# Patient Record
Sex: Female | Born: 1950 | Race: White | Hispanic: No | Smoking: Never smoker
Health system: Southern US, Community
[De-identification: ages and names within clinical notes are randomized; demographics above are authoritative.]

## PROBLEM LIST (undated history)

## (undated) DIAGNOSIS — I2699 Other pulmonary embolism without acute cor pulmonale: Secondary | ICD-10-CM

## (undated) DIAGNOSIS — M1990 Unspecified osteoarthritis, unspecified site: Secondary | ICD-10-CM

## (undated) DIAGNOSIS — I10 Essential (primary) hypertension: Secondary | ICD-10-CM

## (undated) DIAGNOSIS — E119 Type 2 diabetes mellitus without complications: Secondary | ICD-10-CM

## (undated) HISTORY — DX: Unspecified osteoarthritis, unspecified site: M19.90

## (undated) HISTORY — DX: Type 2 diabetes mellitus without complications: E11.9

## (undated) HISTORY — PX: PARTIAL HYSTERECTOMY: SHX80

## (undated) HISTORY — DX: Essential (primary) hypertension: I10

## (undated) HISTORY — PX: TOTAL HIP ARTHROPLASTY: SHX124

---

## 2005-09-04 DIAGNOSIS — I2699 Other pulmonary embolism without acute cor pulmonale: Secondary | ICD-10-CM

## 2005-09-04 HISTORY — DX: Other pulmonary embolism without acute cor pulmonale: I26.99

## 2006-09-04 HISTORY — PX: OTHER SURGICAL HISTORY: SHX169

## 2018-05-20 DIAGNOSIS — Z1211 Encounter for screening for malignant neoplasm of colon: Secondary | ICD-10-CM | POA: Diagnosis not present

## 2018-05-20 DIAGNOSIS — L600 Ingrowing nail: Secondary | ICD-10-CM | POA: Diagnosis not present

## 2018-05-20 DIAGNOSIS — E1165 Type 2 diabetes mellitus with hyperglycemia: Secondary | ICD-10-CM | POA: Diagnosis not present

## 2018-05-20 DIAGNOSIS — Z1239 Encounter for other screening for malignant neoplasm of breast: Secondary | ICD-10-CM | POA: Diagnosis not present

## 2018-05-20 DIAGNOSIS — M87052 Idiopathic aseptic necrosis of left femur: Secondary | ICD-10-CM | POA: Diagnosis not present

## 2018-05-20 DIAGNOSIS — I872 Venous insufficiency (chronic) (peripheral): Secondary | ICD-10-CM | POA: Diagnosis not present

## 2018-05-21 ENCOUNTER — Other Ambulatory Visit: Payer: Self-pay | Admitting: Internal Medicine

## 2018-05-21 DIAGNOSIS — Z1231 Encounter for screening mammogram for malignant neoplasm of breast: Secondary | ICD-10-CM

## 2018-05-28 DIAGNOSIS — E119 Type 2 diabetes mellitus without complications: Secondary | ICD-10-CM | POA: Diagnosis not present

## 2018-05-28 DIAGNOSIS — N390 Urinary tract infection, site not specified: Secondary | ICD-10-CM | POA: Diagnosis not present

## 2018-05-31 ENCOUNTER — Emergency Department: Payer: Medicare HMO

## 2018-05-31 ENCOUNTER — Emergency Department
Admission: EM | Admit: 2018-05-31 | Discharge: 2018-05-31 | Disposition: A | Discharge status: Home / Self Care | Payer: Medicare HMO | Attending: Emergency Medicine | Admitting: Emergency Medicine

## 2018-05-31 ENCOUNTER — Other Ambulatory Visit: Payer: Self-pay

## 2018-05-31 DIAGNOSIS — N201 Calculus of ureter: Secondary | ICD-10-CM | POA: Diagnosis not present

## 2018-05-31 DIAGNOSIS — Z79899 Other long term (current) drug therapy: Secondary | ICD-10-CM | POA: Insufficient documentation

## 2018-05-31 DIAGNOSIS — Z86711 Personal history of pulmonary embolism: Secondary | ICD-10-CM | POA: Insufficient documentation

## 2018-05-31 DIAGNOSIS — N132 Hydronephrosis with renal and ureteral calculous obstruction: Secondary | ICD-10-CM | POA: Diagnosis not present

## 2018-05-31 DIAGNOSIS — R109 Unspecified abdominal pain: Secondary | ICD-10-CM | POA: Diagnosis present

## 2018-05-31 HISTORY — DX: Other pulmonary embolism without acute cor pulmonale: I26.99

## 2018-05-31 LAB — CBC
HCT: 31.6 % — ABNORMAL LOW (ref 35.0–47.0)
Hemoglobin: 10.9 g/dL — ABNORMAL LOW (ref 12.0–16.0)
MCH: 28.2 pg (ref 26.0–34.0)
MCHC: 34.6 g/dL (ref 32.0–36.0)
MCV: 81.6 fL (ref 80.0–100.0)
PLATELETS: 431 10*3/uL (ref 150–440)
RBC: 3.87 MIL/uL (ref 3.80–5.20)
RDW: 13.1 % (ref 11.5–14.5)
WBC: 12.8 10*3/uL — AB (ref 3.6–11.0)

## 2018-05-31 LAB — BASIC METABOLIC PANEL
Anion gap: 10 (ref 5–15)
BUN: 10 mg/dL (ref 8–23)
CALCIUM: 8.8 mg/dL — AB (ref 8.9–10.3)
CO2: 26 mmol/L (ref 22–32)
CREATININE: 0.68 mg/dL (ref 0.44–1.00)
Chloride: 102 mmol/L (ref 98–111)
GFR calc non Af Amer: >60 mL/min (ref >60)
Glucose, Bld: 128 mg/dL — ABNORMAL HIGH (ref 70–99)
Potassium: 3.2 mmol/L — ABNORMAL LOW (ref 3.5–5.1)
SODIUM: 138 mmol/L (ref 135–145)

## 2018-05-31 LAB — URINALYSIS, COMPLETE (UACMP) WITH MICROSCOPIC
BILIRUBIN URINE: NEGATIVE
Glucose, UA: NEGATIVE mg/dL
HGB URINE DIPSTICK: NEGATIVE
KETONES UR: NEGATIVE mg/dL
Nitrite: NEGATIVE
Protein, ur: 100 mg/dL — AB
Specific Gravity, Urine: 1.019 (ref 1.005–1.030)
pH: 6 (ref 5.0–8.0)

## 2018-05-31 MED ORDER — SODIUM CHLORIDE 0.9 % IV BOLUS: 250.0000 mL | Freq: Once | INTRAVENOUS | Status: DC

## 2018-05-31 MED ORDER — ONDANSETRON 4 MG PO TBDP: 4.0000 mg | ORAL_TABLET | Freq: Three times a day (TID) | ORAL | 0 refills | Status: DC | PRN

## 2018-05-31 MED ORDER — KETOROLAC TROMETHAMINE 30 MG/ML IJ SOLN
30.0000 mg | Freq: Once | INTRAMUSCULAR | Status: AC
  Administered 2018-05-31: 30 mg via INTRAVENOUS
  Filled 2018-05-31: qty 1

## 2018-05-31 NOTE — ED Notes (Signed)
Bladder scan shows currently in bladder

## 2018-05-31 NOTE — ED Triage Notes (Signed)
Pt arrives to ed via POV with complaints of left flank and hip pain. Pt states " I feel like I have a kidney stone", hx of kidney stones, this pain feels similar. Pt also states she has left hip pain, surgery in 2008 on left hip. Pt states 3 days ago pt had to wear depends bc she was having a constatnt trickle, not she is only able to pass a couple tablespoons of urine at a time starting last night. NAD noted at this time.

## 2018-05-31 NOTE — ED Notes (Signed)
Patient to Room 2 via [facility], Morrie Sheldon RN aware of placement.

## 2018-05-31 NOTE — ED Provider Notes (Signed)
[facility] Emergency Department Provider Note   ____________________________________________    I have reviewed the triage vital signs and the nursing notes.   HISTORY  Chief Complaint Flank Pain     HPI Cassandra Bridges is a 67 y.o. female who presents with complaints of left flank pain.  Patient reports the pain started overnight and was severe, it seems to be slightly better now.  She reports she was put on Cipro 2 days ago because of intermittent hematuria, she denies significant dysuria.  No fevers or chills.  Occasional nausea, no vomiting.  Distant history of a kidney stone.  Has not taken anything for pain.  Pain radiates into her left groin.  She thinks that this may be related to her hip pain because she does have chronic left hip pain   Past Medical History:  Diagnosis Date  . PE (pulmonary thromboembolism) (HCC) 2007    There are no active problems to display for this patient.   Past Surgical History:  Procedure Laterality Date  . OTHER SURGICAL HISTORY  2008   left hip replacement    Prior to Admission medications   Medication Sig Start Date End Date Taking? Authorizing Provider  ciprofloxacin (CIPRO) 500 MG tablet Take 500 mg by mouth 2 (two) times daily. 05/29/18  Yes [provider]  DULoxetine (CYMBALTA) 60 MG capsule Take 60 mg by mouth daily.   Yes [provider]  furosemide (LASIX) 20 MG tablet Take 20 mg by mouth.   Yes [provider]  glipiZIDE (GLUCOTROL) 5 MG tablet Take 5 mg by mouth daily before breakfast.   Yes [provider]  ketoconazole (NIZORAL) 2 % cream Apply 1 application topically 2 (two) times daily. 05/25/18  Yes [provider]  lisinopril (PRINIVIL,ZESTRIL) 10 MG tablet Take 10 mg by mouth daily.   Yes [provider]  oxybutynin (DITROPAN) 5 MG tablet Take 5 mg by mouth 2 (two) times daily.   Yes [provider]  traMADol (ULTRAM) 50 MG tablet  Take 50 mg by mouth every 8 (eight) hours as needed for pain. 05/20/18  Yes [provider]  ondansetron (ZOFRAN ODT) 4 MG disintegrating tablet Take 1 tablet (4 mg total) by mouth every 8 (eight) hours as needed for nausea or vomiting. 05/31/18   Jene Every, MD     Allergies Effexor [venlafaxine] and Penicillins  History reviewed. No pertinent family history.  Social History Social History   Tobacco Use  . Smoking status: Never Smoker  . Smokeless tobacco: Never Used  Substance Use Topics  . Alcohol use: Never    Frequency: Never  . Drug use: Never    Review of Systems  Constitutional: No fever/chills Eyes: No visual changes.  ENT: No sore throat. Cardiovascular: Denies chest pain. Respiratory: Denies shortness of breath. Gastrointestinal: As above Genitourinary: As above Musculoskeletal: Negative for back pain. Skin: Negative for rash. Neurological: Negative for headaches    ____________________________________________   PHYSICAL EXAM:  VITAL SIGNS: ED Triage Vitals  Enc Vitals Group     BP 05/31/18 0715 (!) 161/68     Pulse Rate 05/31/18 0715 100     Resp 05/31/18 0930 12     Temp 05/31/18 0715 98.1 F (36.7 C)     Temp Source 05/31/18 0715 Oral     SpO2 05/31/18 0715 97 %     Weight 05/31/18 0716 90.7 kg (200 lb)     Height 05/31/18 0716 1.6 m (5\' 3" )  Head Circumference --      Peak Flow --      Pain Score 05/31/18 0741 9     Pain Loc --      Pain Edu? --      Excl. in GC? --     Constitutional: Alert and oriented. Eyes: Conjunctivae are normal.   Nose: No congestion/rhinnorhea. Mouth/Throat: Mucous membranes are moist.    Cardiovascular: Normal rate, regular rhythm. Grossly normal heart sounds.  Good peripheral circulation. Respiratory: Normal respiratory effort.  No retractions. Lungs CTAB. GI: No abdominal distention, no tenderness to palpation, mild left CVA tenderness Musculoskeletal: No lower extremity tenderness nor edema.    Neurologic:  Normal speech and language. No gross focal neurologic deficits are appreciated.  Skin:  Skin is warm, dry and intact. No rash noted. Psychiatric: Mood and affect are normal. Speech and behavior are normal.  ____________________________________________   LABS (all labs ordered are listed, but only abnormal results are displayed)  Labs Reviewed  URINALYSIS, COMPLETE (UACMP) WITH MICROSCOPIC - Abnormal; Notable for the following components:      Result Value   Color, Urine YELLOW (*)    APPearance HAZY (*)    Protein, ur 100 (*)    Leukocytes, UA SMALL (*)    Bacteria, UA RARE (*)    Crystals PRESENT (*)    All other components within normal limits  BASIC METABOLIC PANEL - Abnormal; Notable for the following components:   Potassium 3.2 (*)    Glucose, Bld 128 (*)    Calcium 8.8 (*)    All other components within normal limits  CBC - Abnormal; Notable for the following components:   WBC 12.8 (*)    Hemoglobin 10.9 (*)    HCT 31.6 (*)    All other components within normal limits  URINE CULTURE   ____________________________________________  EKG  None ____________________________________________  RADIOLOGY CT renal stone study demonstrates moderate left hydronephrosis and dilation of the left ureter, no stone, suspect recently passed ureteral stone ____________________________________________   PROCEDURES  Procedure(s) performed: No  Procedures   Critical Care performed: No ____________________________________________   INITIAL IMPRESSION / ASSESSMENT AND PLAN / ED COURSE  Pertinent labs & imaging results that were available during my care of the patient were reviewed by me and considered in my medical decision making (see chart for details).  Patient presents with left flank pain, differential includes ureterolithiasis, UTI/Pilo, muscular skeletal pain related to her hip.  We will treat with IV Toradol, IV fluids obtain CT renal stone study,  urinalysis and reevaluate  Urinalysis with small leukocytes, some contamination, mixed picture.  CT renal stone study is most consistent with recently passed stone which fits clinically as the patient reports she is feeling much better with no significant pain at this time.  She is taking Cipro at home for UTI, I encouraged her to keep taking that and to return if any fevers worsening pain nausea vomiting or other concerns    ____________________________________________   FINAL CLINICAL IMPRESSION(S) / ED DIAGNOSES  Final diagnoses:  Ureterolithiasis        Note:  This document was prepared using Dragon voice recognition software and may include unintentional dictation errors.    Jene Every, MD 05/31/18 1233

## 2018-05-31 NOTE — ED Notes (Signed)
First Nurse Note: Patient assisted from POV into [facility] complaining of left hip pain.  States she was recently started on Cipro.  Alert and oriented, NAD.

## 2018-06-01 LAB — URINE CULTURE

## 2018-06-03 ENCOUNTER — Ambulatory Visit (INDEPENDENT_AMBULATORY_CARE_PROVIDER_SITE_OTHER): Payer: Medicare HMO | Admitting: Orthopaedic Surgery

## 2018-06-03 ENCOUNTER — Other Ambulatory Visit (INDEPENDENT_AMBULATORY_CARE_PROVIDER_SITE_OTHER): Payer: Self-pay

## 2018-06-03 ENCOUNTER — Ambulatory Visit (INDEPENDENT_AMBULATORY_CARE_PROVIDER_SITE_OTHER): Payer: Medicare HMO

## 2018-06-03 ENCOUNTER — Encounter: Payer: Self-pay | Admitting: Podiatry

## 2018-06-03 ENCOUNTER — Ambulatory Visit: Payer: Medicare HMO | Admitting: Podiatry

## 2018-06-03 DIAGNOSIS — M87051 Idiopathic aseptic necrosis of right femur: Secondary | ICD-10-CM | POA: Diagnosis not present

## 2018-06-03 DIAGNOSIS — M25552 Pain in left hip: Secondary | ICD-10-CM | POA: Diagnosis not present

## 2018-06-03 DIAGNOSIS — L03032 Cellulitis of left toe: Secondary | ICD-10-CM

## 2018-06-03 DIAGNOSIS — M25551 Pain in right hip: Secondary | ICD-10-CM

## 2018-06-03 DIAGNOSIS — Z96642 Presence of left artificial hip joint: Secondary | ICD-10-CM

## 2018-06-03 NOTE — Progress Notes (Signed)
Subjective:   Patient ID: Cassandra Bridges, female   DOB: 66 y.o.   MRN: [id number]   HPI Patient presents stating she has had some redness and deformity of the left big toenail with drainage and she is on Cipro currently and is still sore in the corner.  Does not remember specific injury he has had diabetes for a long time with her last A1c being 7.0 she does not smoke and is active   Review of Systems  All other systems reviewed and are negative.       Objective:  Physical Exam  Constitutional: She appears well-developed and well-nourished.  Cardiovascular: Intact distal pulses.  Pulmonary/Chest: Effort normal.  Musculoskeletal: Normal range of motion.  Neurological: She is alert.  Skin: Skin is warm.  Nursing note and vitals reviewed.   Neurovascular status was found to be intact with muscle strength adequate and range of motion within normal limits.  Lateral border left hallux is slightly red with no active drainage noted and there is irritation of the nailbed in this corner.  There is no other pathology noted and she does have a rash on the medial side of the right foot but that is being treated currently with a antifungal cream and the oral antibiotic     Assessment:  Paronychia infection of the left hallux with what appears to be some form of dermatitis or fungal infection right arch     Plan:  H&P conditions reviewed and on focusing on the left nail bed.  I infiltrated 60 mg like Marcaine mixture using sterile instrumentation under sterile prep of the toe I did remove the lateral border clean the area out removed all necrotic tissue and allow channel for drainage and instructed on soaks.  Sterile dressing applied instructed to leave it on 24 hours and less it should get too tight where she will take it off earlier and I gave her instructions on monitoring this if any issues were to occur she is to call immediately

## 2018-06-03 NOTE — Patient Instructions (Signed)

## 2018-06-03 NOTE — Progress Notes (Signed)
   Subjective:    Patient ID: Cassandra Bridges, female    DOB: Jun 07, 1951, 67 y.o.   MRN: [id number]  HPI    Review of Systems  Respiratory: Positive for shortness of breath.   All other systems reviewed and are negative.      Objective:   Physical Exam        Assessment & Plan:

## 2018-06-03 NOTE — Progress Notes (Signed)
Office Visit Note   Patient: Cassandra Bridges           Date of Birth: 06/26/1951           MRN: [id number] Visit Date: 06/03/2018              Requested by: Georgann Housekeeper, MD [address] [place], [place] [address] PCP: Georgann Housekeeper, MD   Assessment & Plan: Visit Diagnoses:  1. Pain in left hip   2. History of total left hip replacement   3. Avascular necrosis of bone of hip, right (HCC)     Plan: Given her known avascular necrosis of her right hip as well as her painful left total hip arthroplasty I would like to obtain an MRI of both hips to assess the extent of AVN of her right hip and determine whether or not that it is just wall itself off.  Also want to see if there is any fluid collections around her left total hip to see if there is any evidence of hardware failure.  We will see her back after these MRIs.  Still she understands that we cannot really do anything from a surgical standpoint from a arthroplasty standpoint until she has her teeth taken care of.  Follow-Up Instructions: Return in about 3 weeks (around 06/24/2018).   Orders:  Orders Placed This Encounter  Procedures  . XR HIP UNILAT W OR W/O PELVIS 1V LEFT   No orders of the defined types were placed in this encounter.     Procedures: No procedures performed   Clinical Data: No additional findings.   Subjective: Chief Complaint  Patient presents with  . Left Hip - Pain  Patient is a very pleasant 67 year old female who comes for evaluation treatment of chronic left hip pain and right hip pain.  She is someone who does report a history of other medical issues.  She had a pulmonary embolus in the past.  She is on blood thinners as well.  She does report significant problems with her teeth and has dental caries and other abscesses that need to be addressed at some point.  She had a left hip replaced in [place] in 2008 Hurt her for the last several years and she was told that it was  wearing out.  Again this was according to her.  She also was reported a history of bilateral hip avascular necrosis.  She does get groin pain on both right and left hips.  This is been getting slowly worse with time.  HPI  Review of Systems Currently she denies any other active medical issues other than problems with her teeth.  Objective: Vital Signs: There were no vitals taken for this visit.  Physical Exam She is alert and oriented x3 and in no acute distress Ortho Exam Examination of both hips show the move fluidly with full internal and external rotation with minimal discomfort and pain however there is pain in the groin on examination of both hips. Specialty Comments:  No specialty comments available.  Imaging: Xr Hip Unilat W Or W/o Pelvis 1v Left  Result Date: 06/03/2018 An AP pelvis and lateral left hip shows a well-seated total hip arthroplasty with no complicating features or acute findings.  I did independently review a CT scan that is been done recently arrived in the pelvis due to kidney stones.  On the CT scan I see no complicating features as it relates to her left total hip arthroplasty.  There  is a small nidus of avascular necrosis in the femoral head on the right side but it appears to be sclerotic and walled off.  Both hips are located well.  I do not see any significant issues with the lumbar spine.  PMFS History: Patient Active Problem List   Diagnosis Date Noted  . History of total left hip replacement 06/03/2018  . Avascular necrosis of bone of hip, right (HCC) 06/03/2018   Past Medical History:  Diagnosis Date  . PE (pulmonary thromboembolism) (HCC) 2007    No family history on file.  Past Surgical History:  Procedure Laterality Date  . OTHER SURGICAL HISTORY  2008   left hip replacement   Social History   Occupational History  . Not on file  Tobacco Use  . Smoking status: Never Smoker  . Smokeless tobacco: Never Used  Substance and Sexual  Activity  . Alcohol use: Never    Frequency: Never  . Drug use: Never  . Sexual activity: Not on file

## 2018-06-17 ENCOUNTER — Ambulatory Visit: Payer: Self-pay

## 2018-06-19 ENCOUNTER — Ambulatory Visit: Payer: Medicare HMO

## 2018-06-21 ENCOUNTER — Other Ambulatory Visit: Payer: Self-pay | Admitting: Nurse Practitioner

## 2018-06-21 ENCOUNTER — Ambulatory Visit
Admission: RE | Admit: 2018-06-21 | Discharge: 2018-06-21 | Disposition: A | Discharge status: Home / Self Care | Payer: Medicare HMO | Source: Ambulatory Visit | Attending: Nurse Practitioner | Admitting: Nurse Practitioner

## 2018-06-21 DIAGNOSIS — R300 Dysuria: Secondary | ICD-10-CM | POA: Diagnosis not present

## 2018-06-21 DIAGNOSIS — W19XXXA Unspecified fall, initial encounter: Secondary | ICD-10-CM | POA: Diagnosis not present

## 2018-06-21 DIAGNOSIS — M25562 Pain in left knee: Secondary | ICD-10-CM

## 2018-06-21 DIAGNOSIS — S8992XA Unspecified injury of left lower leg, initial encounter: Secondary | ICD-10-CM | POA: Diagnosis not present

## 2018-06-24 ENCOUNTER — Ambulatory Visit (INDEPENDENT_AMBULATORY_CARE_PROVIDER_SITE_OTHER): Payer: Medicare HMO | Admitting: Orthopaedic Surgery

## 2018-07-26 ENCOUNTER — Ambulatory Visit: Payer: Medicare HMO

## 2018-07-26 ENCOUNTER — Ambulatory Visit
Admission: RE | Admit: 2018-07-26 | Discharge: 2018-07-26 | Disposition: A | Discharge status: Home / Self Care | Payer: Medicare HMO | Source: Ambulatory Visit | Attending: Internal Medicine | Admitting: Internal Medicine

## 2018-07-26 DIAGNOSIS — Z1231 Encounter for screening mammogram for malignant neoplasm of breast: Secondary | ICD-10-CM | POA: Diagnosis not present

## 2018-08-08 DIAGNOSIS — N200 Calculus of kidney: Secondary | ICD-10-CM | POA: Diagnosis not present

## 2018-08-08 DIAGNOSIS — N3946 Mixed incontinence: Secondary | ICD-10-CM | POA: Diagnosis not present

## 2018-08-08 DIAGNOSIS — N130 Hydronephrosis with ureteropelvic junction obstruction: Secondary | ICD-10-CM | POA: Diagnosis not present

## 2018-08-08 DIAGNOSIS — R310 Gross hematuria: Secondary | ICD-10-CM | POA: Diagnosis not present

## 2019-04-29 ENCOUNTER — Other Ambulatory Visit: Payer: Self-pay | Admitting: Internal Medicine

## 2019-04-29 DIAGNOSIS — R5381 Other malaise: Secondary | ICD-10-CM

## 2019-05-02 ENCOUNTER — Other Ambulatory Visit: Payer: Self-pay | Admitting: Internal Medicine

## 2019-05-02 DIAGNOSIS — E2839 Other primary ovarian failure: Secondary | ICD-10-CM

## 2019-07-15 ENCOUNTER — Other Ambulatory Visit: Payer: Self-pay | Admitting: Internal Medicine

## 2019-07-15 DIAGNOSIS — Z1231 Encounter for screening mammogram for malignant neoplasm of breast: Secondary | ICD-10-CM

## 2019-07-18 ENCOUNTER — Other Ambulatory Visit: Payer: Medicare HMO

## 2019-09-14 NOTE — Progress Notes (Signed)
Cardiology Office Note:   Date:  09/16/2019  NAME:  Cassandra Bridges    MRN: [id number] DOB:  [birth year]   PCP:  Wenda Low, MD  Cardiologist:  No primary care provider on file.   Referring MD: Wenda Low, MD   Chief Complaint  Patient presents with  . Shortness of Breath   History of Present Illness:   Cassandra Bridges is a 69 y.o. female with a hx of diabetes, hypertension who is being seen today for the evaluation of shortness of breath at the request of Wenda Low, MD.  She reports worsening shortness of breath over the past 3 to 4 months.  She reports she can only walk 10 to 15 yards prior to profound shortness of breath.  She also states she feels tired daily.  She reports lower extremity edema that is controlled with Lasix.  She also reports exertional neck pain at times.  No overt chest pain.  She also has headaches during the day.  She reports she was told she had sleep apnea in the 1990s but is not taking her diagnosis that well.  She has been unable to tolerate CPAP in the past.  Blood pressure is elevated today.  She has noted to be tachycardic on examination but had recent thyroid studies are normal.  Her primary care physician obtained a CT PE study that was negative for any embolism.  I do not have the results of that scan but we do have the report from her physician that it was normal.  No prior history of myocardial infarction.  No prior history of heart failure and no prior history of stroke.  She does have significant cardiovascular disease risk factors including diabetes, hyperlipidemia and hypertension.  A1c 8.0 T chol 169 TG 124 HDL 57 LDL 87 Cr 0.76 TSH 2.45  Past Medical History: Past Medical History:  Diagnosis Date  . Arthritis   . Diabetes mellitus without complication ([place])   . Hypertension   . PE (pulmonary thromboembolism) ([place]) 2007    Past Surgical History: Past Surgical History:  Procedure Laterality Date  . OTHER SURGICAL HISTORY   2008   left hip replacement  . PARTIAL HYSTERECTOMY    . TOTAL HIP ARTHROPLASTY      Current Medications: Current Meds  Medication Sig  . DULoxetine (CYMBALTA) 60 MG capsule Take 60 mg by mouth daily.  . furosemide (LASIX) 20 MG tablet Take 20 mg by mouth.  Marland Kitchen glipiZIDE (GLUCOTROL) 5 MG tablet Take 5 mg by mouth daily before breakfast.  . ketoconazole (NIZORAL) 2 % cream Apply 1 application topically 2 (two) times daily.  Marland Kitchen lisinopril (PRINIVIL,ZESTRIL) 10 MG tablet Take 10 mg by mouth daily.  . ondansetron (ZOFRAN ODT) 4 MG disintegrating tablet Take 1 tablet (4 mg total) by mouth every 8 (eight) hours as needed for nausea or vomiting.  . traMADol (ULTRAM) 50 MG tablet Take 50 mg by mouth every 8 (eight) hours as needed for pain.  . [DISCONTINUED] ciprofloxacin (CIPRO) 500 MG tablet Take 500 mg by mouth 2 (two) times daily.  . [DISCONTINUED] oxybutynin (DITROPAN) 5 MG tablet Take 5 mg by mouth 2 (two) times daily.     Allergies:    Effexor [venlafaxine] and Penicillins   Social History: Social History   Socioeconomic History  . Marital status: Divorced    Spouse name: Not on file  . Number of children: 2  . Years of education: Not on file  . Highest education level: Not  on file  Occupational History  . Occupation: retired  Tobacco Use  . Smoking status: Never Smoker  . Smokeless tobacco: Never Used  Substance and Sexual Activity  . Alcohol use: Never  . Drug use: Never  . Sexual activity: Not on file  Other Topics Concern  . Not on file  Social History Narrative  . Not on file   Social Determinants of Health   Financial Resource Strain:   . Difficulty of Paying Living Expenses: Not on file  Food Insecurity:   . Worried About Programme researcher, broadcasting/film/video in the Last Year: Not on file  . Ran Out of Food in the Last Year: Not on file  Transportation Needs:   . Lack of Transportation (Medical): Not on file  . Lack of Transportation (Non-Medical): Not on file  Physical  Activity:   . Days of Exercise per Week: Not on file  . Minutes of Exercise per Session: Not on file  Stress:   . Feeling of Stress : Not on file  Social Connections:   . Frequency of Communication with Friends and Family: Not on file  . Frequency of Social Gatherings with Friends and Family: Not on file  . Attends Religious Services: Not on file  . Active Member of Clubs or Organizations: Not on file  . Attends Banker Meetings: Not on file  . Marital Status: Not on file     Family History: The patient's family history includes Cancer in her mother; Heart attack in her father.  ROS:   All other ROS reviewed and negative. Pertinent positives noted in the HPI.     EKGs/Labs/Other Studies Reviewed:   The following studies were personally reviewed by me today:  EKG:  EKG is ordered today.  The ekg ordered today demonstrates sinus tachycardia, heart rate 109, right bundle branch block noted, no acute ischemic changes, no evidence of prior infarction, and was personally reviewed by me.   Recent Labs: No results found for requested labs within last 8760 hours.   Recent Lipid Panel No results found for: CHOL, TRIG, HDL, CHOLHDL, VLDL, LDLCALC, LDLDIRECT  Physical Exam:   VS:  BP (!) 148/76 (BP Location: Right Arm, Patient Position: Sitting, Cuff Size: Large)   Pulse (!) 108   Ht 5\' 3"  (1.6 m)   Wt 211 lb (95.7 kg)   SpO2 99%   BMI 37.38 kg/m    Wt Readings from Last 3 Encounters:  09/16/19 211 lb (95.7 kg)  05/31/18 200 lb (90.7 kg)    General: Well nourished, well developed, in no acute distress Heart: Atraumatic, normal size  Eyes: PEERLA, EOMI  Neck: Supple, no JVD Endocrine: No thryomegaly Cardiac: Normal S1, S2; RRR; no murmurs, rubs, or gallops Lungs: Clear to auscultation bilaterally, no wheezing, rhonchi or rales  Abd: Soft, nontender, no hepatomegaly  Ext: 1+ pitting edema Musculoskeletal: No deformities, BUE and BLE strength normal and equal Skin:  Warm and dry, no rashes   Neuro: Alert and oriented to person, place, time, and situation, CNII-XII grossly intact, no focal deficits  Psych: Normal mood and affect   ASSESSMENT:   Cassandra Bridges is a 69 y.o. female who presents for the following: 1. SOB (shortness of breath) on exertion   2. Essential hypertension   3. Mixed hyperlipidemia   4. Precordial pain     PLAN:   1. SOB (shortness of breath) on exertion -She presents with worsening exertional shortness of breath.  I have concerns for  possibly congestive heart failure versus obstructive CAD.  She does have intermittent episodes of neck pain which could represent an anginal equivalent.  Given her diabetes and hypertension she has considerable risk factors.  She is also obese.  We will proceed with a TSH, BNP, echocardiogram as well as a Lexiscan nuclear medicine stress test.  Her EKG demonstrates right bundle branch block and she could have considerable RV dysfunction or pulmonary hypertension.  She has 1+ lower extreme edema but does not appear grossly volume overloaded.  Her obesity precludes accurate assessment of volume status.  I think the above work-up will let us know more about her heart function as well as if there is significant volume with a BNP value.  A Lexiscan will be prudent to exclude obstructive CAD.  Her symptoms could also be related to poorly controlled sleep apnea and resultant hypertension.  2. Essential hypertension -We will continue current medications.  I think she will likely need a sleep study in the future.  She reports she is interested but will await the above work-up.  3. Mixed hyperlipidemia -Continue current medications.  Likely will need intensification of statin therapy.  Disposition: Return in about 1 month (around 10/17/2019).  Medication Adjustments/Labs and Tests Ordered: Current medicines are reviewed at length with the patient today.  Concerns regarding medicines are outlined above.    Orders Placed This Encounter  Procedures  . TSH  . B Nat Peptide  . MYOCARDIAL PERFUSION IMAGING  . EKG 12-Lead  . ECHOCARDIOGRAM COMPLETE   No orders of the defined types were placed in this encounter.   Patient Instructions  Medication Instructions:  NO CHANGE *If you need a refill on your cardiac medications before your next appointment, please call your pharmacy*  Lab Work: TSH, BNP If you have labs (blood work) drawn today and your tests are completely normal, you will receive your results only by: Marland Kitchen MyChart Message (if you have MyChart) OR . A paper copy in the mail If you have any lab test that is abnormal or we need to change your treatment, we will call you to review the results.  Testing/Procedures: Your physician has requested that you have an echocardiogram. Echocardiography is a painless test that uses sound waves to create images of your heart. It provides your doctor with information about the size and shape of your heart and how well your heart's chambers and valves are working. This procedure takes approximately one hour. There are no restrictions for this procedure.  [address]  SCHEDULE A LEXI SCAN.  Follow-Up: At [facility], you and your health needs are our priority.  As part of our continuing mission to provide you with exceptional heart care, we have created designated Provider Care Teams.  These Care Teams include your primary Cardiologist (physician) and Advanced Practice Providers (APPs -  Physician Assistants and Nurse Practitioners) who all work together to provide you with the care you need, when you need it.  Your next appointment:   1 month(s)  The format for your next appointment:   In Person  Provider:   Lennie Odor, MD       Signed, Lenna Gilford. Flora Lipps, MD [facility]  [address] [place], [place] [address] [phone]  09/16/2019 5:23 PM

## 2019-09-16 ENCOUNTER — Other Ambulatory Visit: Payer: Self-pay

## 2019-09-16 ENCOUNTER — Ambulatory Visit: Payer: Medicare Other | Admitting: Cardiovascular Disease

## 2019-09-16 ENCOUNTER — Encounter: Payer: Self-pay | Admitting: Cardiovascular Disease

## 2019-09-16 VITALS — BP 148/76 | HR 108 | Ht 63.0 in | Wt 211.0 lb

## 2019-09-16 DIAGNOSIS — I10 Essential (primary) hypertension: Secondary | ICD-10-CM

## 2019-09-16 DIAGNOSIS — E782 Mixed hyperlipidemia: Secondary | ICD-10-CM

## 2019-09-16 DIAGNOSIS — R072 Precordial pain: Secondary | ICD-10-CM | POA: Diagnosis not present

## 2019-09-16 DIAGNOSIS — R0602 Shortness of breath: Secondary | ICD-10-CM | POA: Diagnosis not present

## 2019-09-16 NOTE — Patient Instructions (Signed)
Medication Instructions:  NO CHANGE *If you need a refill on your cardiac medications before your next appointment, please call your pharmacy*  Lab Work: TSH, BNP If you have labs (blood work) drawn today and your tests are completely normal, you will receive your results only by: Marland Kitchen MyChart Message (if you have MyChart) OR . A paper copy in the mail If you have any lab test that is abnormal or we need to change your treatment, we will call you to review the results.  Testing/Procedures: Your physician has requested that you have an echocardiogram. Echocardiography is a painless test that uses sound waves to create images of your heart. It provides your doctor with information about the size and shape of your heart and how well your heart's chambers and valves are working. This procedure takes approximately one hour. There are no restrictions for this procedure.  [address]  SCHEDULE A LEXI SCAN.  Follow-Up: At [facility], you and your health needs are our priority.  As part of our continuing mission to provide you with exceptional heart care, we have created designated Provider Care Teams.  These Care Teams include your primary Cardiologist (physician) and Advanced Practice Providers (APPs -  Physician Assistants and Nurse Practitioners) who all work together to provide you with the care you need, when you need it.  Your next appointment:   1 month(s)  The format for your next appointment:   In Person  Provider:   Lennie Odor, MD

## 2019-09-17 ENCOUNTER — Telehealth: Payer: Self-pay

## 2019-09-17 LAB — BRAIN NATRIURETIC PEPTIDE: BNP: 27.3 pg/mL (ref 0.0–100.0)

## 2019-09-17 LAB — TSH: TSH: 2.45 u[IU]/mL (ref 0.450–4.500)

## 2019-09-17 NOTE — Telephone Encounter (Signed)
Spoke with nurse at Dr. Jerelyn Scott Husain's office. Asked for report from pts CTPE study to be faxed to our office. Waiting on fax.

## 2019-09-17 NOTE — Telephone Encounter (Signed)
Opened in error

## 2019-09-17 NOTE — Telephone Encounter (Signed)
spoke with patient over the phone with reminder to take her BP medications. Patient stated that "I took them as soon as I got home yesterday. I was afraid they needed to do blood work so I didn't eat or drink or take my medicine that morning. You can tell Dr. Flora Lipps everything is okay."  Reminded pt of her upcoming Echo and Lexi test and gave instructions of where to come in to office for the Echo.   No other questions or concerns at this time.

## 2019-09-17 NOTE — Telephone Encounter (Signed)
Received paperwork on CTPE for patient. Will place in Dr. Marylene Buerger mailbox.

## 2019-09-17 NOTE — Telephone Encounter (Signed)
Attempted to leave VM for patient with a reminder to take her blood pressure medication as prescribed prior to coming into office. No answer and voice mailbox was full. Will continue to try to reach pt.

## 2019-09-23 ENCOUNTER — Telehealth (HOSPITAL_COMMUNITY): Payer: Self-pay

## 2019-09-23 NOTE — Telephone Encounter (Signed)
Encounter complete. 

## 2019-09-24 ENCOUNTER — Other Ambulatory Visit: Payer: Self-pay

## 2019-09-24 ENCOUNTER — Ambulatory Visit (HOSPITAL_COMMUNITY)
Admission: RE | Admit: 2019-09-24 | Discharge: 2019-09-24 | Disposition: A | Discharge status: Home / Self Care | Payer: Medicare Other | Source: Ambulatory Visit | Attending: Cardiology | Admitting: Cardiology

## 2019-09-24 DIAGNOSIS — R0602 Shortness of breath: Secondary | ICD-10-CM | POA: Diagnosis present

## 2019-09-24 DIAGNOSIS — I10 Essential (primary) hypertension: Secondary | ICD-10-CM | POA: Diagnosis not present

## 2019-09-24 DIAGNOSIS — E782 Mixed hyperlipidemia: Secondary | ICD-10-CM | POA: Diagnosis present

## 2019-09-24 DIAGNOSIS — R072 Precordial pain: Secondary | ICD-10-CM | POA: Insufficient documentation

## 2019-09-24 LAB — MYOCARDIAL PERFUSION IMAGING
LV dias vol: 76 mL (ref 46–106)
LV sys vol: 25 mL
Peak HR: 104 {beats}/min
Rest HR: 90 {beats}/min
SDS: 1
SRS: 1
SSS: 2
TID: 0.96

## 2019-09-24 MED ORDER — REGADENOSON 0.4 MG/5ML IV SOLN
0.4000 mg | Freq: Once | INTRAVENOUS | Status: AC
  Administered 2019-09-24: 0.4 mg via INTRAVENOUS

## 2019-09-24 MED ORDER — TECHNETIUM TC 99M TETROFOSMIN IV KIT
9.6000 | PACK | Freq: Once | INTRAVENOUS | Status: AC | PRN
  Administered 2019-09-24: 9.6 via INTRAVENOUS
  Filled 2019-09-24: qty 10

## 2019-09-24 MED ORDER — TECHNETIUM TC 99M TETROFOSMIN IV KIT
32.9000 | PACK | Freq: Once | INTRAVENOUS | Status: AC | PRN
  Administered 2019-09-24: 32.9 via INTRAVENOUS
  Filled 2019-09-24: qty 33

## 2019-09-26 ENCOUNTER — Other Ambulatory Visit: Payer: Self-pay

## 2019-09-26 ENCOUNTER — Ambulatory Visit (HOSPITAL_COMMUNITY): Payer: Medicare Other | Attending: Cardiology

## 2019-09-26 DIAGNOSIS — R072 Precordial pain: Secondary | ICD-10-CM | POA: Diagnosis not present

## 2019-09-26 DIAGNOSIS — R0602 Shortness of breath: Secondary | ICD-10-CM | POA: Insufficient documentation

## 2019-09-26 DIAGNOSIS — E782 Mixed hyperlipidemia: Secondary | ICD-10-CM | POA: Diagnosis not present

## 2019-09-26 DIAGNOSIS — I10 Essential (primary) hypertension: Secondary | ICD-10-CM | POA: Insufficient documentation

## 2019-10-03 ENCOUNTER — Ambulatory Visit
Admission: RE | Admit: 2019-10-03 | Discharge: 2019-10-03 | Disposition: A | Discharge status: Home / Self Care | Payer: Medicare Other | Source: Ambulatory Visit | Attending: Internal Medicine | Admitting: Internal Medicine

## 2019-10-03 ENCOUNTER — Other Ambulatory Visit: Payer: Self-pay

## 2019-10-03 DIAGNOSIS — Z1231 Encounter for screening mammogram for malignant neoplasm of breast: Secondary | ICD-10-CM

## 2019-10-03 DIAGNOSIS — E2839 Other primary ovarian failure: Secondary | ICD-10-CM

## 2019-10-15 NOTE — Progress Notes (Deleted)
Cardiology Office Note:   Date:  10/15/2019  NAME:  Cassandra Bridges    MRN: [id number] DOB:  1951-08-23   PCP:  Georgann Housekeeper, MD  Cardiologist:  No primary care provider on file.  Electrophysiologist:  None   Referring MD: Georgann Housekeeper, MD   No chief complaint on file. ***  History of Present Illness:   Cassandra Bridges is a 69 y.o. female with a hx of diabetes, hypertension, hyperlipidemia, obesity who presents for follow-up of shortness of breath.  Was evaluated in January 2021 for shortness of breath.  BNP 27.  Cardiac stress test without ischemia.  Echocardiogram demonstrated normal left ventricular ejection fraction and grade 1 diastolic dysfunction.  There was mention of elevated left atrial pressure.  Problem List  1. Diabetes -A1c 8.0 2. HLD -T chol 169, HDL 57, LDL 87   Past Medical History: Past Medical History:  Diagnosis Date  . Arthritis   . Diabetes mellitus without complication (HCC)   . Hypertension   . PE (pulmonary thromboembolism) (HCC) 2007    Past Surgical History: Past Surgical History:  Procedure Laterality Date  . OTHER SURGICAL HISTORY  2008   left hip replacement  . PARTIAL HYSTERECTOMY    . TOTAL HIP ARTHROPLASTY      Current Medications: No outpatient medications have been marked as taking for the 10/17/19 encounter (Appointment) with O'Neal, Ronnald Ramp, MD.     Allergies:    Effexor [venlafaxine] and Penicillins   Social History: Social History   Socioeconomic History  . Marital status: Divorced    Spouse name: Not on file  . Number of children: 2  . Years of education: Not on file  . Highest education level: Not on file  Occupational History  . Occupation: retired  Tobacco Use  . Smoking status: Never Smoker  . Smokeless tobacco: Never Used  Substance and Sexual Activity  . Alcohol use: Never  . Drug use: Never  . Sexual activity: Not on file  Other Topics Concern  . Not on file  Social History Narrative  .  Not on file   Social Determinants of Health   Financial Resource Strain:   . Difficulty of Paying Living Expenses: Not on file  Food Insecurity:   . Worried About Programme researcher, broadcasting/film/video in the Last Year: Not on file  . Ran Out of Food in the Last Year: Not on file  Transportation Needs:   . Lack of Transportation (Medical): Not on file  . Lack of Transportation (Non-Medical): Not on file  Physical Activity:   . Days of Exercise per Week: Not on file  . Minutes of Exercise per Session: Not on file  Stress:   . Feeling of Stress : Not on file  Social Connections:   . Frequency of Communication with Friends and Family: Not on file  . Frequency of Social Gatherings with Friends and Family: Not on file  . Attends Religious Services: Not on file  . Active Member of Clubs or Organizations: Not on file  . Attends Banker Meetings: Not on file  . Marital Status: Not on file     Family History: The patient's ***family history includes Cancer in her mother; Heart attack in her father.  ROS:   All other ROS reviewed and negative. Pertinent positives noted in the HPI.     EKGs/Labs/Other Studies Reviewed:   The following studies were personally reviewed by me today:  EKG:  EKG is *** ordered today.  The ekg ordered today demonstrates ***, and was personally reviewed by me.   TTE 09/26/2019 1. Left ventricular ejection fraction, by visual estimation, is 60 to  65%. The left ventricle has normal function. There is mildly increased  left ventricular hypertrophy.  2. Elevated left atrial pressure.  3. Left ventricular diastolic parameters are consistent with Grade I  diastolic dysfunction (impaired relaxation).  4. The left ventricle has no regional wall motion abnormalities.  5. Global right ventricle has normal systolic function.The right  ventricular size is normal.  6. Left atrial size was normal.  7. Right atrial size was normal.  8. Mild mitral annular  calcification.  9. The mitral valve is normal in structure. Mild mitral valve  regurgitation. No evidence of mitral stenosis.  10. The tricuspid valve is normal in structure. Tricuspid valve  regurgitation is trivial.  11. The aortic valve is tricuspid. Aortic valve regurgitation is not  visualized. Mild aortic valve sclerosis without stenosis.  12. The pulmonic valve was not well visualized. Pulmonic valve  regurgitation is not visualized.  13. The inferior vena cava is normal in size with greater than 50%  respiratory variability, suggesting right atrial pressure of 3 mmHg.  14. Vigorous LV systolic function; mild LVH; grade 1 diastolic dysfunction  with elevated filling pressure; mild MR.   NM Stress 09/24/2019 1. Normal myocardial perfusion imaging study without evidence of ischemia or infarction.  2. Normal LVEF, >65%.  3. Prominent RV noted, which is commonly seen in setting of pulmonary hypertension.  4. This is a low-risk study.   Recent Labs: 09/16/2019: BNP 27.3; TSH 2.450   Recent Lipid Panel No results found for: CHOL, TRIG, HDL, CHOLHDL, VLDL, LDLCALC, LDLDIRECT  Physical Exam:   VS:  There were no vitals taken for this visit.   Wt Readings from Last 3 Encounters:  09/24/19 211 lb (95.7 kg)  09/16/19 211 lb (95.7 kg)  05/31/18 200 lb (90.7 kg)    General: Well nourished, well developed, in no acute distress Heart: Atraumatic, normal size  Eyes: PEERLA, EOMI  Neck: Supple, no JVD Endocrine: No thryomegaly Cardiac: Normal S1, S2; RRR; no murmurs, rubs, or gallops Lungs: Clear to auscultation bilaterally, no wheezing, rhonchi or rales  Abd: Soft, nontender, no hepatomegaly  Ext: No edema, pulses 2+ Musculoskeletal: No deformities, BUE and BLE strength normal and equal Skin: Warm and dry, no rashes   Neuro: Alert and oriented to person, place, time, and situation, CNII-XII grossly intact, no focal deficits  Psych: Normal mood and affect   ASSESSMENT:   Cassandra  Shaye Bridges is a 69 y.o. female who presents for the following: No diagnosis found.  PLAN:   There are no diagnoses linked to this encounter.  Disposition: No follow-ups on file.  Medication Adjustments/Labs and Tests Ordered: Current medicines are reviewed at length with the patient today.  Concerns regarding medicines are outlined above.  No orders of the defined types were placed in this encounter.  No orders of the defined types were placed in this encounter.   There are no Patient Instructions on file for this visit.   Signed, Addison Naegeli. Audie Box, [place]  [address] [place], [place] [address] [phone]  10/15/2019 7:51 PM

## 2019-10-17 ENCOUNTER — Ambulatory Visit: Payer: Medicare Other | Admitting: Cardiovascular Disease

## 2019-10-26 NOTE — Progress Notes (Deleted)
Cardiology Office Note:   Date:  10/26/2019  NAME:  Cassandra Bridges    MRN: [id number] DOB:  [birth year]   PCP:  Wenda Low, MD  Cardiologist:  No primary care provider on file.   Referring MD: Wenda Low, MD   No chief complaint on file. ***  History of Present Illness:   Cassandra Bridges is a 69 y.o. female with a hx of diabetes and hypertension who presents for follow-up of shortness of breath.  Echocardiogram unremarkable.  Nuclear medicine stress test negative for ischemia.   A1c 8.0 T chol 169 TG 124 HDL 57 LDL 87 Cr 0.76 TSH 2.45  Past Medical History: Past Medical History:  Diagnosis Date  . Arthritis   . Diabetes mellitus without complication ([place])   . Hypertension   . PE (pulmonary thromboembolism) ([place]) 2007    Past Surgical History: Past Surgical History:  Procedure Laterality Date  . OTHER SURGICAL HISTORY  2008   left hip replacement  . PARTIAL HYSTERECTOMY    . TOTAL HIP ARTHROPLASTY      Current Medications: No outpatient medications have been marked as taking for the 10/27/19 encounter (Appointment) with O'Neal, Cassie Freer, MD.     Allergies:    Effexor [venlafaxine] and Penicillins   Social History: Social History   Socioeconomic History  . Marital status: Divorced    Spouse name: Not on file  . Number of children: 2  . Years of education: Not on file  . Highest education level: Not on file  Occupational History  . Occupation: retired  Tobacco Use  . Smoking status: Never Smoker  . Smokeless tobacco: Never Used  Substance and Sexual Activity  . Alcohol use: Never  . Drug use: Never  . Sexual activity: Not on file  Other Topics Concern  . Not on file  Social History Narrative  . Not on file   Social Determinants of Health   Financial Resource Strain:   . Difficulty of Paying Living Expenses: Not on file  Food Insecurity:   . Worried About Charity fundraiser in the Last Year: Not on file  . Ran Out of Food  in the Last Year: Not on file  Transportation Needs:   . Lack of Transportation (Medical): Not on file  . Lack of Transportation (Non-Medical): Not on file  Physical Activity:   . Days of Exercise per Week: Not on file  . Minutes of Exercise per Session: Not on file  Stress:   . Feeling of Stress : Not on file  Social Connections:   . Frequency of Communication with Friends and Family: Not on file  . Frequency of Social Gatherings with Friends and Family: Not on file  . Attends Religious Services: Not on file  . Active Member of Clubs or Organizations: Not on file  . Attends Archivist Meetings: Not on file  . Marital Status: Not on file     Family History: The patient's ***family history includes Cancer in her mother; Heart attack in her father.  ROS:   All other ROS reviewed and negative. Pertinent positives noted in the HPI.     EKGs/Labs/Other Studies Reviewed:   The following studies were personally reviewed by me today:  EKG:  EKG is *** ordered today.  The ekg ordered today demonstrates ***, and was personally reviewed by me.   NM Stress 09/24/2019 1. Normal myocardial perfusion imaging study without evidence of ischemia or infarction.  2. Normal LVEF, >65%.  3. Prominent RV noted, which is commonly seen in setting of pulmonary hypertension.  4. This is a low-risk study.   TTE 09/26/2019 1. Left ventricular ejection fraction, by visual estimation, is 60 to  65%. The left ventricle has normal function. There is mildly increased  left ventricular hypertrophy.  2. Elevated left atrial pressure.  3. Left ventricular diastolic parameters are consistent with Grade I  diastolic dysfunction (impaired relaxation).  4. The left ventricle has no regional wall motion abnormalities.  5. Global right ventricle has normal systolic function.The right  ventricular size is normal.  6. Left atrial size was normal.  7. Right atrial size was normal.  8. Mild mitral  annular calcification.  9. The mitral valve is normal in structure. Mild mitral valve  regurgitation. No evidence of mitral stenosis.  10. The tricuspid valve is normal in structure. Tricuspid valve  regurgitation is trivial.  11. The aortic valve is tricuspid. Aortic valve regurgitation is not  visualized. Mild aortic valve sclerosis without stenosis.  12. The pulmonic valve was not well visualized. Pulmonic valve  regurgitation is not visualized.  13. The inferior vena cava is normal in size with greater than 50%  respiratory variability, suggesting right atrial pressure of 3 mmHg.  14. Vigorous LV systolic function; mild LVH; grade 1 diastolic dysfunction  with elevated filling pressure; mild MR.     Recent Labs: 09/16/2019: BNP 27.3; TSH 2.450   Recent Lipid Panel No results found for: CHOL, TRIG, HDL, CHOLHDL, VLDL, LDLCALC, LDLDIRECT  Physical Exam:   VS:  There were no vitals taken for this visit.   Wt Readings from Last 3 Encounters:  09/24/19 211 lb (95.7 kg)  09/16/19 211 lb (95.7 kg)  05/31/18 200 lb (90.7 kg)    General: Well nourished, well developed, in no acute distress Heart: Atraumatic, normal size  Eyes: PEERLA, EOMI  Neck: Supple, no JVD Endocrine: No thryomegaly Cardiac: Normal S1, S2; RRR; no murmurs, rubs, or gallops Lungs: Clear to auscultation bilaterally, no wheezing, rhonchi or rales  Abd: Soft, nontender, no hepatomegaly  Ext: No edema, pulses 2+ Musculoskeletal: No deformities, BUE and BLE strength normal and equal Skin: Warm and dry, no rashes   Neuro: Alert and oriented to person, place, time, and situation, CNII-XII grossly intact, no focal deficits  Psych: Normal mood and affect   ASSESSMENT:   Cassandra Bridges is a 69 y.o. female who presents for the following: No diagnosis found.  PLAN:   There are no diagnoses linked to this encounter.  Disposition: No follow-ups on file.  Medication Adjustments/Labs and Tests Ordered: Current  medicines are reviewed at length with the patient today.  Concerns regarding medicines are outlined above.  No orders of the defined types were placed in this encounter.  No orders of the defined types were placed in this encounter.   There are no Patient Instructions on file for this visit.   Signed, Lenna Gilford. Flora Lipps, MD [facility]  [address], [address] [place], [place] [address] [phone]  10/26/2019 4:15 PM

## 2019-10-27 ENCOUNTER — Ambulatory Visit: Payer: Medicare Other | Admitting: Cardiovascular Disease

## 2019-11-11 NOTE — Progress Notes (Signed)
Cardiology Office Note:   Date:  11/12/2019  NAME:  Cassandra Bridges    MRN: [id number] DOB:  25-Sep-1950   PCP:  Georgann Housekeeper, MD  Cardiologist:  No primary care provider on file.   Referring MD: Georgann Housekeeper, MD   Chief Complaint  Patient presents with  . Shortness of Breath   History of Present Illness:   Cassandra Bridges is a 69 y.o. female with a hx of HTN, DM, HLD who presents for follow-up of SOB. Echo and NM stress test unremarkable.  She reports she still getting short of breath.  She is only able to walk 1-2 blocks prior to fatigue.  She does report increasing lower extremity edema as well.  Weights appear to be stable.  Review of her echocardiogram does show diastolic dysfunction with elevated left atrial pressure.  She also reports that she does get pain in her legs.  She has poor pulses on examination.  This appears to occur with exertion.  CVD risk factors include hypertension, diabetes.  Her heart rate has also been relatively high.  She has had a negative CT PE study.  She does have PE study in the past.  Most recent thyroid studies were normal.  Overall, I am concerned for possible claudication versus lower extremity DVT.  I do think she has an element of congestive heart failure that is likely masked by a low BNP in the setting of obesity.  Problem List 1. Diabetes -A1c 8.0 2. HLD -T chol 169, TG 124, HDL 57, LDL 97 3. HTN 4. HFpEF  Past Medical History: Past Medical History:  Diagnosis Date  . Arthritis   . Diabetes mellitus without complication (HCC)   . Hypertension   . PE (pulmonary thromboembolism) (HCC) 2007    Past Surgical History: Past Surgical History:  Procedure Laterality Date  . OTHER SURGICAL HISTORY  2008   left hip replacement  . PARTIAL HYSTERECTOMY    . TOTAL HIP ARTHROPLASTY      Current Medications: Current Meds  Medication Sig  . DULoxetine (CYMBALTA) 60 MG capsule Take 60 mg by mouth daily.  . furosemide (LASIX) 40 MG  tablet Take 1 tablet (40 mg total) by mouth daily.  Marland Kitchen glipiZIDE (GLUCOTROL) 5 MG tablet Take 5 mg by mouth daily before breakfast.  . ketoconazole (NIZORAL) 2 % cream Apply 1 application topically 2 (two) times daily.  Marland Kitchen lisinopril (PRINIVIL,ZESTRIL) 10 MG tablet Take 10 mg by mouth daily.  . ondansetron (ZOFRAN ODT) 4 MG disintegrating tablet Take 1 tablet (4 mg total) by mouth every 8 (eight) hours as needed for nausea or vomiting.  . traMADol (ULTRAM) 50 MG tablet Take 50 mg by mouth every 8 (eight) hours as needed for pain.  . [DISCONTINUED] furosemide (LASIX) 20 MG tablet Take 20 mg by mouth.     Allergies:    Effexor [venlafaxine] and Penicillins   Social History: Social History   Socioeconomic History  . Marital status: Divorced    Spouse name: Not on file  . Number of children: 2  . Years of education: Not on file  . Highest education level: Not on file  Occupational History  . Occupation: retired  Tobacco Use  . Smoking status: Never Smoker  . Smokeless tobacco: Never Used  Substance and Sexual Activity  . Alcohol use: Never  . Drug use: Never  . Sexual activity: Not on file  Other Topics Concern  . Not on file  Social History Narrative  .  Not on file   Social Determinants of Health   Financial Resource Strain:   . Difficulty of Paying Living Expenses: Not on file  Food Insecurity:   . Worried About Programme researcher, broadcasting/film/video in the Last Year: Not on file  . Ran Out of Food in the Last Year: Not on file  Transportation Needs:   . Lack of Transportation (Medical): Not on file  . Lack of Transportation (Non-Medical): Not on file  Physical Activity:   . Days of Exercise per Week: Not on file  . Minutes of Exercise per Session: Not on file  Stress:   . Feeling of Stress : Not on file  Social Connections:   . Frequency of Communication with Friends and Family: Not on file  . Frequency of Social Gatherings with Friends and Family: Not on file  . Attends Religious  Services: Not on file  . Active Member of Clubs or Organizations: Not on file  . Attends Banker Meetings: Not on file  . Marital Status: Not on file     Family History: The patient's family history includes Cancer in her mother; Heart attack in her father.  ROS:   All other ROS reviewed and negative. Pertinent positives noted in the HPI.     EKGs/Labs/Other Studies Reviewed:   The following studies were personally reviewed by me today:  TTE 09/26/2019 1. Left ventricular ejection fraction, by visual estimation, is 60 to  65%. The left ventricle has normal function. There is mildly increased  left ventricular hypertrophy.  2. Elevated left atrial pressure.  3. Left ventricular diastolic parameters are consistent with Grade I  diastolic dysfunction (impaired relaxation).  4. The left ventricle has no regional wall motion abnormalities.  5. Global right ventricle has normal systolic function.The right  ventricular size is normal.  6. Left atrial size was normal.  7. Right atrial size was normal.  8. Mild mitral annular calcification.  9. The mitral valve is normal in structure. Mild mitral valve  regurgitation. No evidence of mitral stenosis.  10. The tricuspid valve is normal in structure. Tricuspid valve  regurgitation is trivial.  11. The aortic valve is tricuspid. Aortic valve regurgitation is not  visualized. Mild aortic valve sclerosis without stenosis.  12. The pulmonic valve was not well visualized. Pulmonic valve  regurgitation is not visualized.  13. The inferior vena cava is normal in size with greater than 50%  respiratory variability, suggesting right atrial pressure of 3 mmHg.  14. Vigorous LV systolic function; mild LVH; grade 1 diastolic dysfunction  with elevated filling pressure; mild MR.    NM MPI 09/24/2019 1. Normal myocardial perfusion imaging study without evidence of ischemia or infarction.  2. Normal LVEF, >65%.  3. Prominent RV  noted, which is commonly seen in setting of pulmonary hypertension.  4. This is a low-risk study.   Recent Labs: 09/16/2019: BNP 27.3; TSH 2.450   Recent Lipid Panel No results found for: CHOL, TRIG, HDL, CHOLHDL, VLDL, LDLCALC, LDLDIRECT  Physical Exam:   VS:  BP 134/72   Pulse (!) 102   Ht 5\' 3"  (1.6 m)   Wt 205 lb 3.2 oz (93.1 kg)   SpO2 97%   BMI 36.35 kg/m    Wt Readings from Last 3 Encounters:  11/12/19 205 lb 3.2 oz (93.1 kg)  09/24/19 211 lb (95.7 kg)  09/16/19 211 lb (95.7 kg)    General: Well nourished, well developed, in no acute distress Heart: Atraumatic, normal size  Eyes: PEERLA, EOMI  Neck: Supple, no JVD Endocrine: No thryomegaly Cardiac: Normal S1, S2; RRR; no murmurs, rubs, or gallops Lungs: Clear to auscultation bilaterally, no wheezing, rhonchi or rales  Abd: Soft, nontender, no hepatomegaly  Ext: 2+ pitting edema, chronic venous insufficiency changes, diminished pulses bilaterally Musculoskeletal: No deformities, BUE and BLE strength normal and equal Skin: Warm and dry, no rashes   Neuro: Alert and oriented to person, place, time, and situation, CNII-XII grossly intact, no focal deficits  Psych: Normal mood and affect   ASSESSMENT:   Cassandra Bridges is a 69 y.o. female who presents for the following: 1. SOB (shortness of breath) on exertion   2. Chronic diastolic heart failure (HCC)   3. Essential hypertension   4. Mixed hyperlipidemia   5. Leg edema   6. Pain in both lower extremities     PLAN:   1. SOB (shortness of breath) on exertion 2. Chronic diastolic heart failure (HCC) -She continues to have shortness of breath on exertion.  Her echocardiogram does show diastolic dysfunction with elevated left atrial pressure.  Her BNP was low but this could be due to obesity.  I think we will go ahead and increase her Lasix to 40 mg daily. -I will also go ahead and add carvedilol.  This may help with diastolic filling.  3. Essential  hypertension -We will add carvedilol 12.5 mg twice daily as above.  We will continue lisinopril 10 mg daily.  4. Mixed hyperlipidemia -Most recent LDL cholesterol 87.  She is not on a statin medication.  Goal cholesterol is less than 70 given diabetes.  We will add Crestor 20 mg daily.  5. Leg edema 6. Pain in both lower extremities -I suspect most of her lower extremity edema is related to diastolic heart failure.  Increase Lasix as above. -Given history of PE we will go ahead and exclude DVT with lower extremity venous duplex -Her diminished pulses are also concerning given her diabetes she will need to be screened for PAD.  We will obtain lower extremity arterial duplexes with ABIs.  Disposition: Return in about 1 month (around 12/13/2019).  Medication Adjustments/Labs and Tests Ordered: Current medicines are reviewed at length with the patient today.  Concerns regarding medicines are outlined above.  Orders Placed This Encounter  Procedures  . VAS [place] ABI WITH/WO TBI  . VAS [place] LOWER EXTREMITY ARTERIAL DUPLEX  . VAS [place] LOWER EXTREMITY VENOUS (DVT)   Meds ordered this encounter  Medications  . rosuvastatin (CRESTOR) 20 MG tablet    Sig: Take 1 tablet (20 mg total) by mouth daily.    Dispense:  90 tablet    Refill:  3  . carvedilol (COREG) 12.5 MG tablet    Sig: Take 1 tablet (12.5 mg total) by mouth 2 (two) times daily.    Dispense:  180 tablet    Refill:  3  . furosemide (LASIX) 40 MG tablet    Sig: Take 1 tablet (40 mg total) by mouth daily.    Dispense:  90 tablet    Refill:  1    Patient Instructions  Medication Instructions:  Start Carvedilol 12.5 twice daily  Increase Lasix 40 mg daily   Start Crestor 20 mg daily   *If you need a refill on your cardiac medications before your next appointment, please call your pharmacy*  Testing/Procedures: Your physician has requested that you have a lower or upper extremity arterial duplex. This test is an ultrasound of the  arteries  in the legs or arms. It looks at arterial blood flow in the legs and arms. Allow one hour for Lower and Upper Arterial scans. There are no restrictions or special instructions  Your physician has requested that you have a lower or upper extremity venous duplex. This test is an ultrasound of the veins in the legs or arms. It looks at venous blood flow that carries blood from the heart to the legs or arms. Allow one hour for a Lower Venous exam. Allow thirty minutes for an Upper Venous exam. There are no restrictions or special instructions.   Your physician has requested that you have an ankle brachial index (ABI). During this test an ultrasound and blood pressure cuff are used to evaluate the arteries that supply the arms and legs with blood. Allow thirty minutes for this exam. There are no restrictions or special instructions.   Follow-Up: At [facility], you and your health needs are our priority.  As part of our continuing mission to provide you with exceptional heart care, we have created designated Provider Care Teams.  These Care Teams include your primary Cardiologist (physician) and Advanced Practice Providers (APPs -  Physician Assistants and Nurse Practitioners) who all work together to provide you with the care you need, when you need it.  We recommend signing up for the patient portal called "MyChart".  Sign up information is provided on this After Visit Summary.  MyChart is used to connect with patients for Virtual Visits (Telemedicine).  Patients are able to view lab/test results, encounter notes, upcoming appointments, etc.  Non-urgent messages can be sent to your provider as well.   To learn more about what you can do with MyChart, go to [email or web address].    Your next appointment:   1 month(s)  The format for your next appointment:   In Person  Provider:   Eleonore Chiquito, MD      Time Spent with Patient: I have spent a total of 35 minutes with patient  reviewing hospital notes, telemetry, EKGs, labs and examining the patient as well as establishing an assessment and plan that was discussed with the patient.  > 50% of time was spent in direct patient care.  Signed, Addison Naegeli. Audie Box, [place]  [address], [place], [place] [address] [phone]  11/12/2019 5:35 PM

## 2019-11-12 ENCOUNTER — Other Ambulatory Visit: Payer: Self-pay

## 2019-11-12 ENCOUNTER — Encounter: Payer: Self-pay | Admitting: Cardiovascular Disease

## 2019-11-12 ENCOUNTER — Ambulatory Visit: Payer: Medicare Other | Admitting: Cardiovascular Disease

## 2019-11-12 VITALS — BP 134/72 | HR 102 | Ht 63.0 in | Wt 205.2 lb

## 2019-11-12 DIAGNOSIS — E782 Mixed hyperlipidemia: Secondary | ICD-10-CM | POA: Diagnosis not present

## 2019-11-12 DIAGNOSIS — M79604 Pain in right leg: Secondary | ICD-10-CM

## 2019-11-12 DIAGNOSIS — I5032 Chronic diastolic (congestive) heart failure: Secondary | ICD-10-CM | POA: Diagnosis not present

## 2019-11-12 DIAGNOSIS — R0602 Shortness of breath: Secondary | ICD-10-CM

## 2019-11-12 DIAGNOSIS — I10 Essential (primary) hypertension: Secondary | ICD-10-CM

## 2019-11-12 DIAGNOSIS — M79605 Pain in left leg: Secondary | ICD-10-CM

## 2019-11-12 DIAGNOSIS — R600 Localized edema: Secondary | ICD-10-CM

## 2019-11-12 MED ORDER — CARVEDILOL 12.5 MG PO TABS: 12.5000 mg | ORAL_TABLET | Freq: Two times a day (BID) | ORAL | 3 refills | Status: DC

## 2019-11-12 MED ORDER — ROSUVASTATIN CALCIUM 20 MG PO TABS: 20.0000 mg | ORAL_TABLET | Freq: Every day | ORAL | 3 refills | Status: DC

## 2019-11-12 MED ORDER — FUROSEMIDE 40 MG PO TABS: 40.0000 mg | ORAL_TABLET | Freq: Every day | ORAL | 1 refills | Status: DC

## 2019-11-12 NOTE — Patient Instructions (Signed)
Medication Instructions:  Start Carvedilol 12.5 twice daily  Increase Lasix 40 mg daily   Start Crestor 20 mg daily   *If you need a refill on your cardiac medications before your next appointment, please call your pharmacy*  Testing/Procedures: Your physician has requested that you have a lower or upper extremity arterial duplex. This test is an ultrasound of the arteries in the legs or arms. It looks at arterial blood flow in the legs and arms. Allow one hour for Lower and Upper Arterial scans. There are no restrictions or special instructions  Your physician has requested that you have a lower or upper extremity venous duplex. This test is an ultrasound of the veins in the legs or arms. It looks at venous blood flow that carries blood from the heart to the legs or arms. Allow one hour for a Lower Venous exam. Allow thirty minutes for an Upper Venous exam. There are no restrictions or special instructions.   Your physician has requested that you have an ankle brachial index (ABI). During this test an ultrasound and blood pressure cuff are used to evaluate the arteries that supply the arms and legs with blood. Allow thirty minutes for this exam. There are no restrictions or special instructions.   Follow-Up: At [facility], you and your health needs are our priority.  As part of our continuing mission to provide you with exceptional heart care, we have created designated Provider Care Teams.  These Care Teams include your primary Cardiologist (physician) and Advanced Practice Providers (APPs -  Physician Assistants and Nurse Practitioners) who all work together to provide you with the care you need, when you need it.  We recommend signing up for the patient portal called "MyChart".  Sign up information is provided on this After Visit Summary.  MyChart is used to connect with patients for Virtual Visits (Telemedicine).  Patients are able to view lab/test results, encounter notes, upcoming  appointments, etc.  Non-urgent messages can be sent to your provider as well.   To learn more about what you can do with MyChart, go to [email or web address].    Your next appointment:   1 month(s)  The format for your next appointment:   In Person  Provider:   Lennie Odor, MD

## 2019-11-25 ENCOUNTER — Ambulatory Visit (HOSPITAL_COMMUNITY)
Admission: RE | Admit: 2019-11-25 | Payer: Medicare Other | Source: Ambulatory Visit | Attending: Cardiovascular Disease | Admitting: Cardiovascular Disease

## 2019-11-25 ENCOUNTER — Ambulatory Visit (HOSPITAL_COMMUNITY)
Admission: RE | Admit: 2019-11-25 | Discharge: 2019-11-25 | Disposition: A | Discharge status: Home / Self Care | Payer: Medicare Other | Source: Ambulatory Visit | Attending: Cardiovascular Disease | Admitting: Cardiovascular Disease

## 2019-11-25 ENCOUNTER — Other Ambulatory Visit: Payer: Self-pay

## 2019-11-25 DIAGNOSIS — M79604 Pain in right leg: Secondary | ICD-10-CM | POA: Diagnosis not present

## 2019-11-25 DIAGNOSIS — M79605 Pain in left leg: Secondary | ICD-10-CM | POA: Insufficient documentation

## 2019-12-04 ENCOUNTER — Ambulatory Visit (HOSPITAL_COMMUNITY)
Admission: RE | Admit: 2019-12-04 | Discharge: 2019-12-04 | Disposition: A | Discharge status: Home / Self Care | Payer: Medicare Other | Source: Ambulatory Visit | Attending: Cardiology | Admitting: Cardiology

## 2019-12-04 ENCOUNTER — Other Ambulatory Visit: Payer: Self-pay

## 2019-12-04 DIAGNOSIS — M79604 Pain in right leg: Secondary | ICD-10-CM | POA: Diagnosis present

## 2019-12-04 DIAGNOSIS — M79605 Pain in left leg: Secondary | ICD-10-CM

## 2019-12-08 IMAGING — MG DIGITAL SCREENING BILATERAL MAMMOGRAM WITH TOMO AND CAD
8 series · 8 of 24 positions shown · non-contrast
Comparison: Previous exam(s).

CLINICAL DATA: Screening.

EXAM:
DIGITAL SCREENING BILATERAL MAMMOGRAM WITH TOMO AND CAD

[R CC synth-2D]
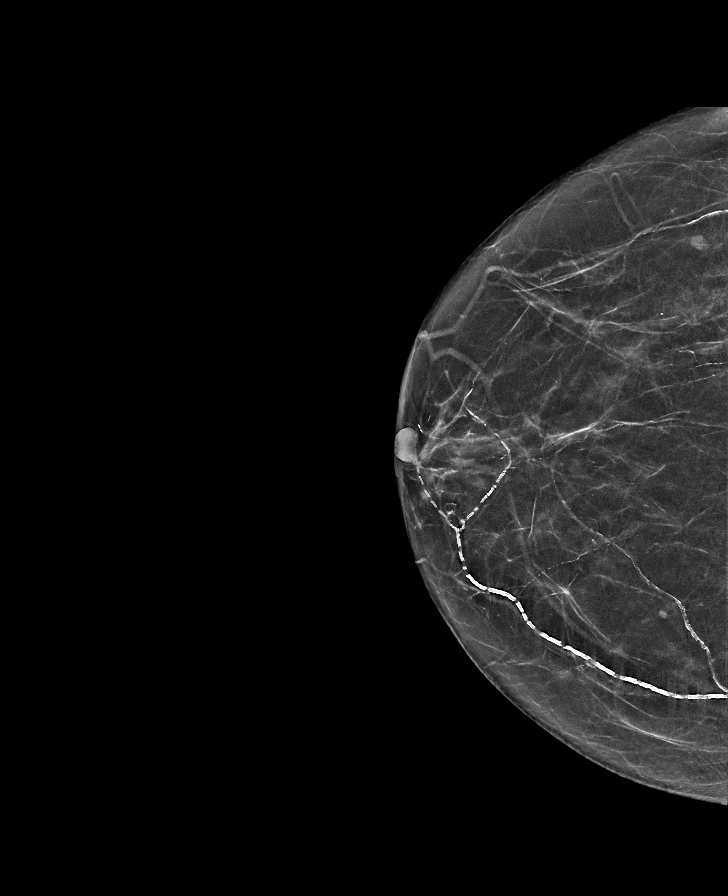

[L MLO synth-2D]
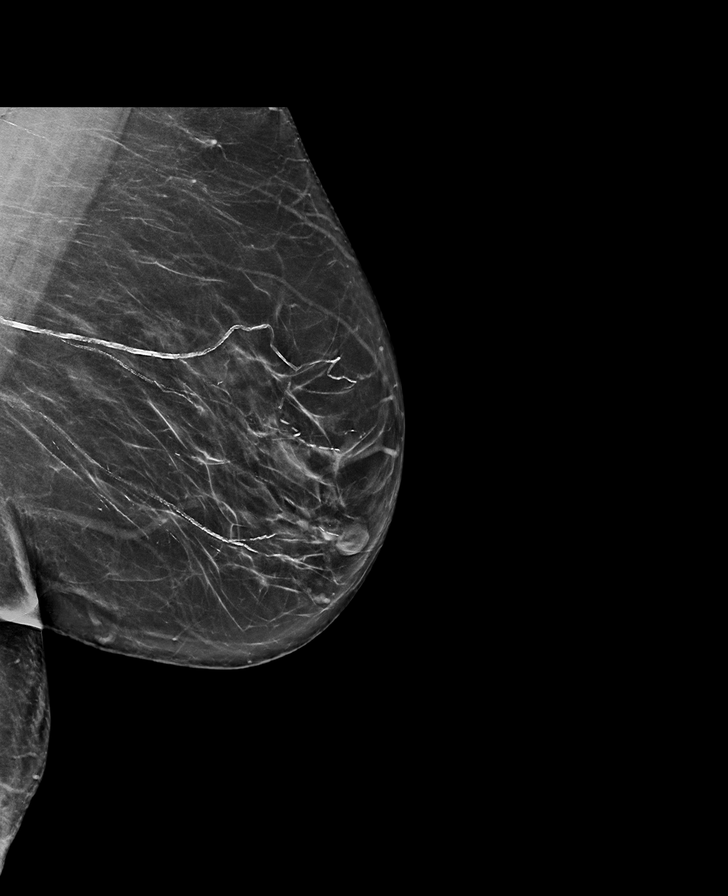

[R MLO synth-2D]
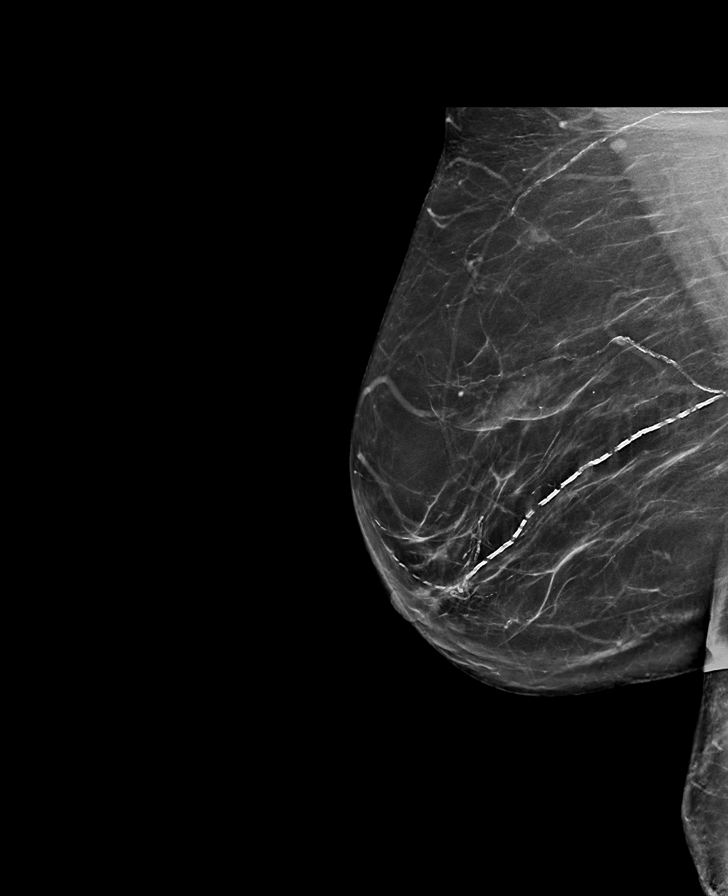

[L CC synth-2D]
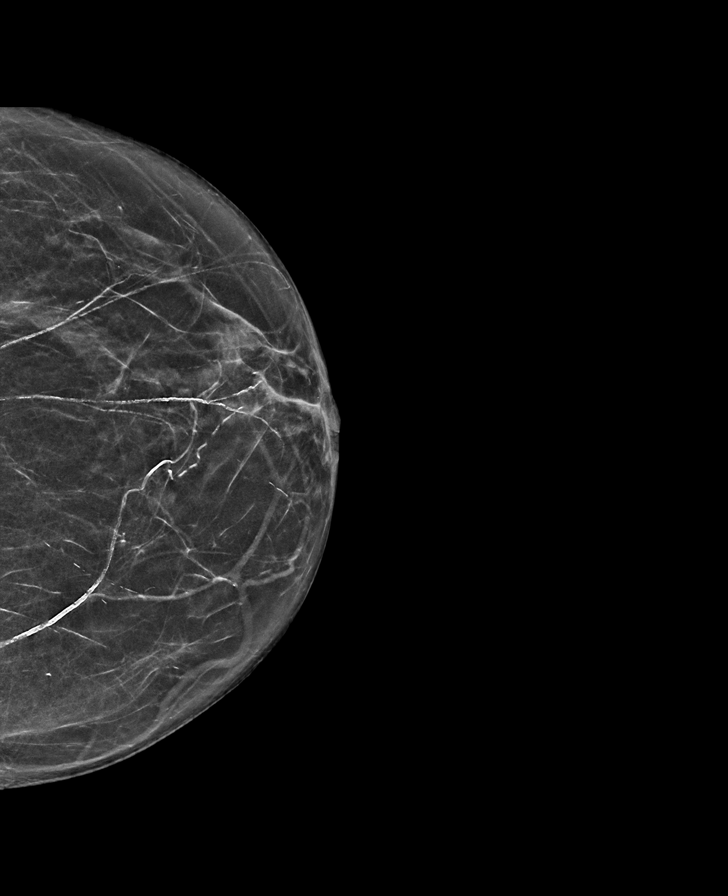

[L CC tomo · tomo slice 29/57.0]
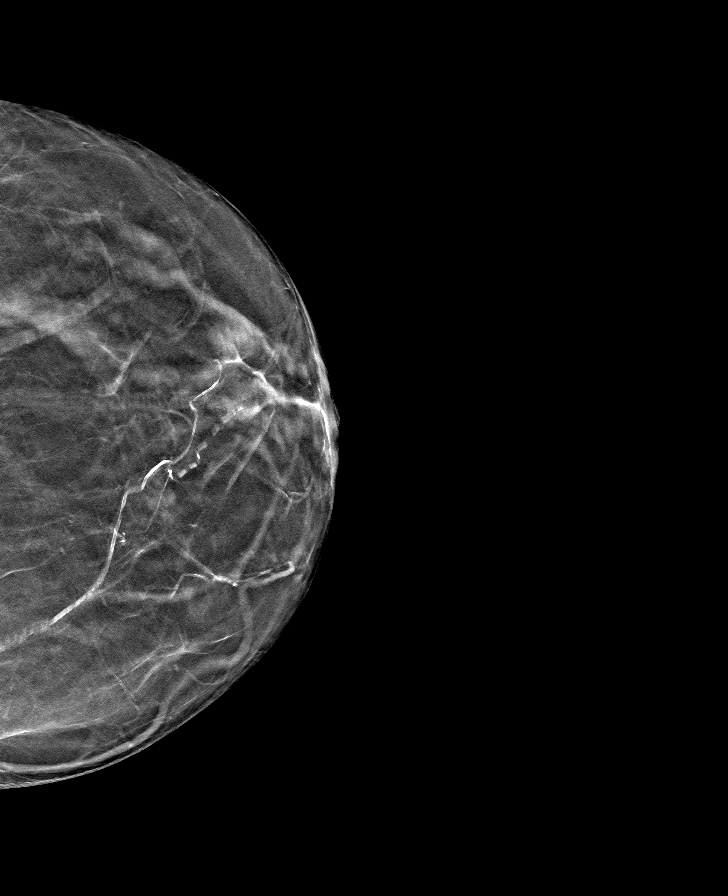

[L MLO tomo · tomo slice 37/74.0]
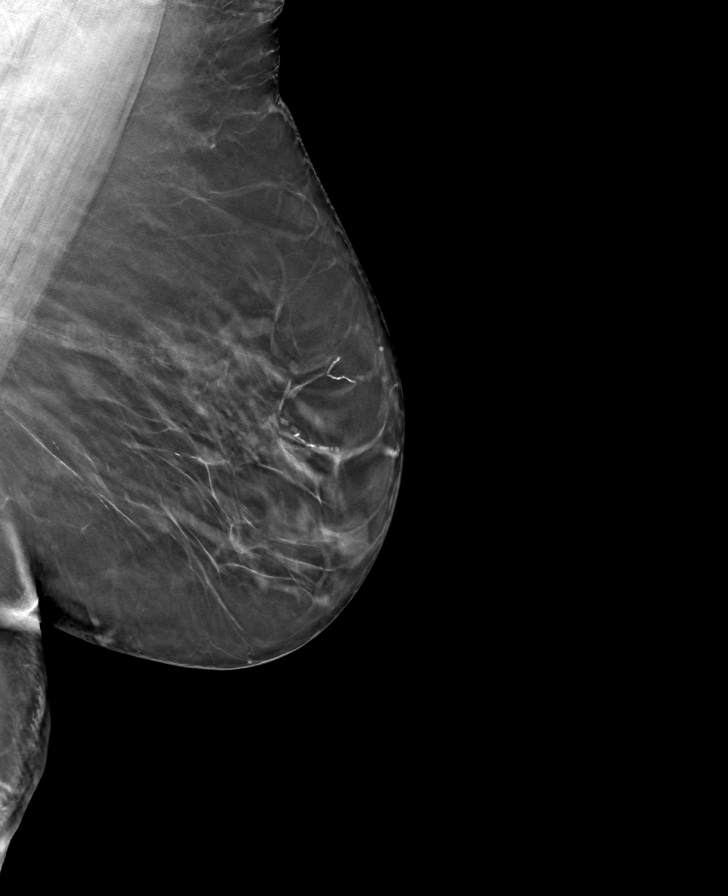

[R CC tomo · tomo slice 30/59.0]
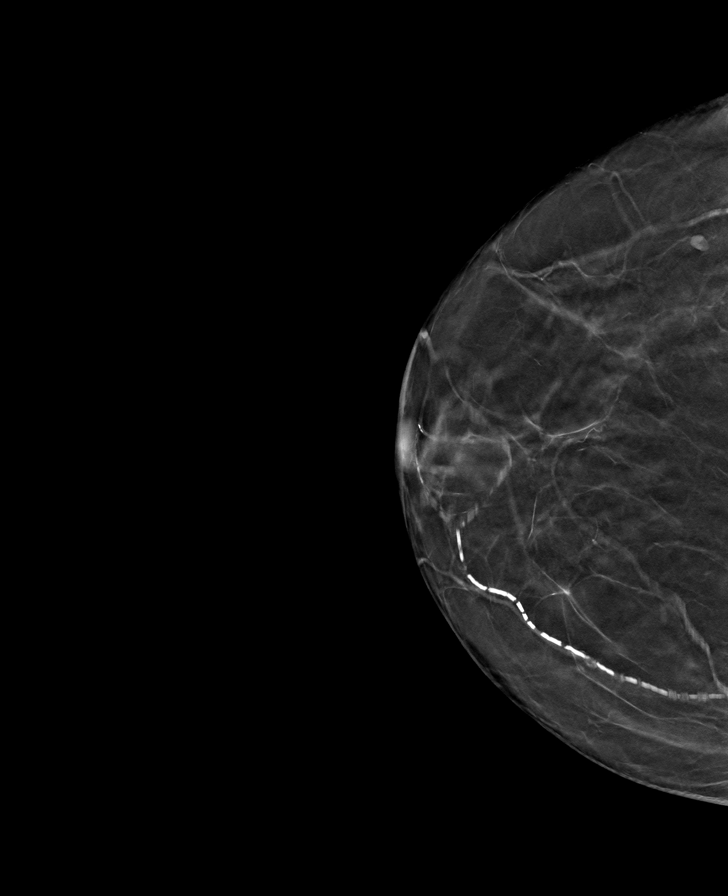

[R MLO tomo · tomo slice 37/74.0]
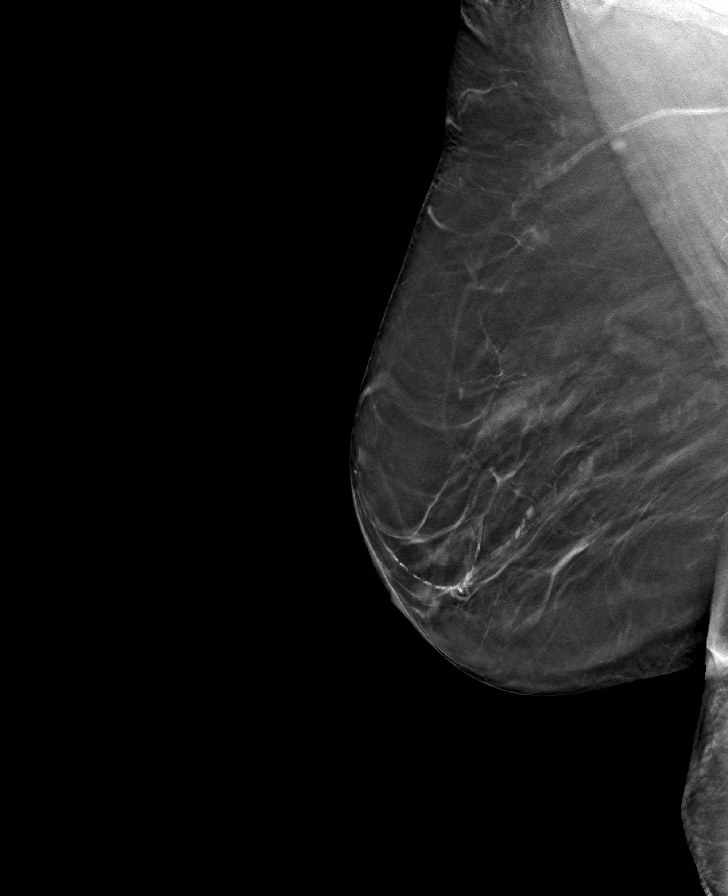

[8 of 24 positions shown; findings below may reference images not displayed]

ACR Breast Density Category b: There are scattered areas of
fibroglandular density.
FINDINGS: There are no findings suspicious for malignancy. Images were
processed with CAD.
IMPRESSION: No mammographic evidence of malignancy. A result letter of this
screening mammogram will be mailed directly to the patient.

RECOMMENDATION:
Screening mammogram in one year. (Code:[id number])

BI-RADS CATEGORY  1: Negative.

## 2019-12-21 NOTE — Progress Notes (Signed)
Cardiology Office Note:   Date:  12/22/2019  NAME:  Cassandra Bridges    MRN: [id number] DOB:  [birth year]   PCP:  Georgann Housekeeper, MD  Cardiologist:  No primary care provider on file.  Electrophysiologist:  None   Referring MD: Georgann Housekeeper, MD   Chief Complaint  Patient presents with  . Shortness of Breath    History of Present Illness:   Cassandra Bridges is a 69 y.o. female with a hx of HFpEF, DM, PAD, HLD who presents for follow-up. Was evaluated for SOB and had stress test that was normal. Echo shows HFpEF. LE duplex with PAD. Negative venous duplex.  She reports worsening lower extremity edema and shortness of breath.  She is taking Lasix 40 mg daily with good urine output.  Review of weights show she is gone up over the past 1 month and 25 pounds at 214 pounds.  She is quite short of breath with minimal activity such as walking 10 to 15 feet.  Blood pressure is much better controlled today.  Overall, things appear to be going in the wrong direction.  I do have my concerns for HFpEF.  She also clearly has venous insufficiency changes in the legs.  Recent ultrasounds of the legs showed PAD.  Symptoms appear to be more related to pain from fluid.  She does get pain in her extremities when she walks and does have PAD.  For now I think we need to get fluid off before we consider any other invasive work-up for PAD.  Problem List 1. Diabetes -A1c 8.0 2. HLD -T chol 169, TG 124, HDL 57, LDL 97 3. HTN 4. HFpEF, 60-65%, G1DD 5. PAD -50-74% stenosis mid and distal R CFA -50-74% stenosis in the mid and distal L CFA  Past Medical History: Past Medical History:  Diagnosis Date  . Arthritis   . Diabetes mellitus without complication (HCC)   . Hypertension   . PE (pulmonary thromboembolism) (HCC) 2007    Past Surgical History: Past Surgical History:  Procedure Laterality Date  . OTHER SURGICAL HISTORY  2008   left hip replacement  . PARTIAL HYSTERECTOMY    . TOTAL HIP  ARTHROPLASTY      Current Medications: Current Meds  Medication Sig  . carvedilol (COREG) 12.5 MG tablet Take 1 tablet (12.5 mg total) by mouth 2 (two) times daily.  . DULoxetine (CYMBALTA) 60 MG capsule Take 60 mg by mouth daily.  . furosemide (LASIX) 40 MG tablet Take 1 tablet (40 mg total) by mouth daily.  Marland Kitchen glipiZIDE (GLUCOTROL) 5 MG tablet Take 5 mg by mouth 2 (two) times daily before a meal.   . ketoconazole (NIZORAL) 2 % cream Apply 1 application topically 2 (two) times daily.  Marland Kitchen lisinopril (PRINIVIL,ZESTRIL) 10 MG tablet Take 10 mg by mouth daily.  . ondansetron (ZOFRAN ODT) 4 MG disintegrating tablet Take 1 tablet (4 mg total) by mouth every 8 (eight) hours as needed for nausea or vomiting.  . rosuvastatin (CRESTOR) 20 MG tablet Take 1 tablet (20 mg total) by mouth daily.  . traMADol (ULTRAM) 50 MG tablet Take 50 mg by mouth every 8 (eight) hours as needed for pain.     Allergies:    Effexor [venlafaxine] and Penicillins   Social History: Social History   Socioeconomic History  . Marital status: Divorced    Spouse name: Not on file  . Number of children: 2  . Years of education: Not on file  . Highest education  level: Not on file  Occupational History  . Occupation: retired  Tobacco Use  . Smoking status: Never Smoker  . Smokeless tobacco: Never Used  Substance and Sexual Activity  . Alcohol use: Never  . Drug use: Never  . Sexual activity: Not on file  Other Topics Concern  . Not on file  Social History Narrative  . Not on file   Social Determinants of Health   Financial Resource Strain:   . Difficulty of Paying Living Expenses:   Food Insecurity:   . Worried About Programme researcher, broadcasting/film/video in the Last Year:   . Barista in the Last Year:   Transportation Needs:   . Freight forwarder (Medical):   Marland Kitchen Lack of Transportation (Non-Medical):   Physical Activity:   . Days of Exercise per Week:   . Minutes of Exercise per Session:   Stress:   . Feeling  of Stress :   Social Connections:   . Frequency of Communication with Friends and Family:   . Frequency of Social Gatherings with Friends and Family:   . Attends Religious Services:   . Active Member of Clubs or Organizations:   . Attends Banker Meetings:   Marland Kitchen Marital Status:      Family History: The patient's family history includes Cancer in her mother; Heart attack in her father.  ROS:   All other ROS reviewed and negative. Pertinent positives noted in the HPI.     EKGs/Labs/Other Studies Reviewed:   The following studies were personally reviewed by me today:  LE Arterial duplex 12/04/2019 Summary:  Right: 50-74 % stenosis in the mid and distal CFA.  50-74% stenosis at the ostial SFA, low end range.  Three vessel run-off.   Left: 50-74 % stenosis in the mid and distal CFA.  30-49% stenosis at the ostial SFA.  Three vessel run-off.   TTE 09/26/2019 1. Left ventricular ejection fraction, by visual estimation, is 60 to  65%. The left ventricle has normal function. There is mildly increased  left ventricular hypertrophy.  2. Elevated left atrial pressure.  3. Left ventricular diastolic parameters are consistent with Grade I  diastolic dysfunction (impaired relaxation).  4. The left ventricle has no regional wall motion abnormalities.  5. Global right ventricle has normal systolic function.The right  ventricular size is normal.  6. Left atrial size was normal.  7. Right atrial size was normal.  8. Mild mitral annular calcification.  9. The mitral valve is normal in structure. Mild mitral valve  regurgitation. No evidence of mitral stenosis.  10. The tricuspid valve is normal in structure. Tricuspid valve  regurgitation is trivial.  11. The aortic valve is tricuspid. Aortic valve regurgitation is not  visualized. Mild aortic valve sclerosis without stenosis.  12. The pulmonic valve was not well visualized. Pulmonic valve  regurgitation is not  visualized.  13. The inferior vena cava is normal in size with greater than 50%  respiratory variability, suggesting right atrial pressure of 3 mmHg.  14. Vigorous LV systolic function; mild LVH; grade 1 diastolic dysfunction  with elevated filling pressure; mild MR.   NM Stress 09/24/2019 1. Normal myocardial perfusion imaging study without evidence of ischemia or infarction.  2. Normal LVEF, >65%.  3. Prominent RV noted, which is commonly seen in setting of pulmonary hypertension.  4. This is a low-risk study.   Recent Labs: 09/16/2019: BNP 27.3; TSH 2.450   Recent Lipid Panel No results found for: CHOL, TRIG,  HDL, CHOLHDL, VLDL, LDLCALC, LDLDIRECT  Physical Exam:   VS:  BP 130/78   Pulse 78   Ht 5\' 3"  (1.6 m)   Wt 214 lb (97.1 kg)   SpO2 95%   BMI 37.91 kg/m    Wt Readings from Last 3 Encounters:  12/22/19 214 lb (97.1 kg)  11/12/19 205 lb 3.2 oz (93.1 kg)  09/24/19 211 lb (95.7 kg)    General: Well nourished, well developed, in no acute distress Heart: Atraumatic, normal size  Eyes: PEERLA, EOMI  Neck: Supple, JVD+ Endocrine: No thryomegaly Cardiac: Normal S1, S2; RRR; no murmurs, rubs, or gallops Lungs: Clear to auscultation bilaterally, no wheezing, rhonchi or rales  Abd: Soft, nontender, no hepatomegaly  Ext: 2+ pitting edema, venous insufficiency changes noted Musculoskeletal: No deformities, BUE and BLE strength normal and equal Skin: Warm and dry, no rashes   Neuro: Alert and oriented to person, place, time, and situation, CNII-XII grossly intact, no focal deficits  Psych: Normal mood and affect   ASSESSMENT:   Cassandra Bridges is a 69 y.o. female who presents for the following: 1. Chronic diastolic heart failure ([place])   2. SOB (shortness of breath) on exertion   3. PAD (peripheral artery disease) ([place])   4. Venous insufficiency   5. Essential hypertension   6. Mixed hyperlipidemia     PLAN:   1. Chronic diastolic heart failure ([place]) 2. SOB  (shortness of breath) on exertion -She is got continued weight gain and lower extreme edema.  She is also quite short of breath.  Most recent echo with normal left ventricular function and grade 1 diastolic dysfunction.  We will go ahead and increase her Lasix to 40 mg twice daily and then resume 40 mg daily.  She was counseled extensively on salt reduction strategies. -I would like to recheck a BNP today.  Given her recent weight gain this value may be increasing.  Given her obesity her neck adiposity precludes accurate JVD assessment.  Blood pressure well controlled today.  She continues to work on her diabetes with most recent A1c 8.0.  3. PAD (peripheral artery disease) (HCC) -Mild to moderate disease bilaterally.  Symptoms appear to be related to lower extremity edema.  We will continue with diuresis as above.  4. Venous insufficiency -Compression stockings recommend  5. Essential hypertension -Continue home blood pressure medication  6. Mixed hyperlipidemia -Continue statin  Disposition: Return in about 1 month (around 01/21/2020).  Medication Adjustments/Labs and Tests Ordered: Current medicines are reviewed at length with the patient today.  Concerns regarding medicines are outlined above.  Orders Placed This Encounter  Procedures  . Brain natriuretic peptide   No orders of the defined types were placed in this encounter.   Patient Instructions  Medication Instructions:  Increase Lasix to 40 mg twice daily for 3 days, then go back to 40 mg daily   *If you need a refill on your cardiac medications before your next appointment, please call your pharmacy*   Lab Work: BNP today   If you have labs (blood work) drawn today and your tests are completely normal, you will receive your results only by: Marland Kitchen MyChart Message (if you have MyChart) OR . A paper copy in the mail If you have any lab test that is abnormal or we need to change your treatment, we will call you to review the  results.   Follow-Up: At [facility], you and your health needs are our priority.  As part of our  continuing mission to provide you with exceptional heart care, we have created designated Provider Care Teams.  These Care Teams include your primary Cardiologist (physician) and Advanced Practice Providers (APPs -  Physician Assistants and Nurse Practitioners) who all work together to provide you with the care you need, when you need it.  We recommend signing up for the patient portal called "MyChart".  Sign up information is provided on this After Visit Summary.  MyChart is used to connect with patients for Virtual Visits (Telemedicine).  Patients are able to view lab/test results, encounter notes, upcoming appointments, etc.  Non-urgent messages can be sent to your provider as well.   To learn more about what you can do with MyChart, go to [email or web address].    Your next appointment:   1 month(s)  The format for your next appointment:   In Person  Provider:   Lennie Odor, MD      Time Spent with Patient: I have spent a total of 25 minutes with patient reviewing hospital notes, telemetry, EKGs, labs and examining the patient as well as establishing an assessment and plan that was discussed with the patient.  > 50% of time was spent in direct patient care.  Signed, Lenna Gilford. Flora Lipps, MD [facility]  [address], [address] [place], [place] [address] [phone]  12/22/2019 2:09 PM

## 2019-12-22 ENCOUNTER — Encounter: Payer: Self-pay | Admitting: Cardiovascular Disease

## 2019-12-22 ENCOUNTER — Ambulatory Visit: Payer: Medicare Other | Admitting: Cardiovascular Disease

## 2019-12-22 ENCOUNTER — Other Ambulatory Visit: Payer: Self-pay

## 2019-12-22 VITALS — BP 130/78 | HR 78 | Ht 63.0 in | Wt 214.0 lb

## 2019-12-22 DIAGNOSIS — I10 Essential (primary) hypertension: Secondary | ICD-10-CM

## 2019-12-22 DIAGNOSIS — I5032 Chronic diastolic (congestive) heart failure: Secondary | ICD-10-CM

## 2019-12-22 DIAGNOSIS — I739 Peripheral vascular disease, unspecified: Secondary | ICD-10-CM

## 2019-12-22 DIAGNOSIS — R0602 Shortness of breath: Secondary | ICD-10-CM

## 2019-12-22 DIAGNOSIS — I872 Venous insufficiency (chronic) (peripheral): Secondary | ICD-10-CM | POA: Diagnosis not present

## 2019-12-22 DIAGNOSIS — E782 Mixed hyperlipidemia: Secondary | ICD-10-CM

## 2019-12-22 NOTE — Patient Instructions (Signed)
Medication Instructions:  Increase Lasix to 40 mg twice daily for 3 days, then go back to 40 mg daily   *If you need a refill on your cardiac medications before your next appointment, please call your pharmacy*   Lab Work: BNP today   If you have labs (blood work) drawn today and your tests are completely normal, you will receive your results only by: Marland Kitchen MyChart Message (if you have MyChart) OR . A paper copy in the mail If you have any lab test that is abnormal or we need to change your treatment, we will call you to review the results.   Follow-Up: At [facility], you and your health needs are our priority.  As part of our continuing mission to provide you with exceptional heart care, we have created designated Provider Care Teams.  These Care Teams include your primary Cardiologist (physician) and Advanced Practice Providers (APPs -  Physician Assistants and Nurse Practitioners) who all work together to provide you with the care you need, when you need it.  We recommend signing up for the patient portal called "MyChart".  Sign up information is provided on this After Visit Summary.  MyChart is used to connect with patients for Virtual Visits (Telemedicine).  Patients are able to view lab/test results, encounter notes, upcoming appointments, etc.  Non-urgent messages can be sent to your provider as well.   To learn more about what you can do with MyChart, go to [email or web address].    Your next appointment:   1 month(s)  The format for your next appointment:   In Person  Provider:   Lennie Odor, MD

## 2019-12-23 LAB — BRAIN NATRIURETIC PEPTIDE: BNP: 24.2 pg/mL (ref 0.0–100.0)

## 2020-01-12 ENCOUNTER — Ambulatory Visit: Payer: Medicare Other | Admitting: Orthopaedic Surgery

## 2020-01-12 ENCOUNTER — Other Ambulatory Visit: Payer: Self-pay

## 2020-01-12 ENCOUNTER — Ambulatory Visit: Payer: Self-pay

## 2020-01-12 VITALS — Ht 63.0 in | Wt 211.0 lb

## 2020-01-12 DIAGNOSIS — Z96642 Presence of left artificial hip joint: Secondary | ICD-10-CM | POA: Diagnosis not present

## 2020-01-12 DIAGNOSIS — M25552 Pain in left hip: Secondary | ICD-10-CM | POA: Diagnosis not present

## 2020-01-12 DIAGNOSIS — M87051 Idiopathic aseptic necrosis of right femur: Secondary | ICD-10-CM | POA: Diagnosis not present

## 2020-01-12 DIAGNOSIS — M25551 Pain in right hip: Secondary | ICD-10-CM

## 2020-01-12 NOTE — Progress Notes (Signed)
Office Visit Note   Patient: Cassandra Bridges           Date of Birth: December 24, 1950           MRN: [id number] Visit Date: 01/12/2020              Requested by: Georgann Housekeeper, MD [address] [place],  [place] [address] PCP: Georgann Housekeeper, MD   Assessment & Plan: Visit Diagnoses:  1. Pain in right hip   2. Pain in left hip   3. History of total left hip replacement   4. Avascular necrosis of bone of hip, right (HCC)     Plan: Given the severity of her pain and previous plain film findings, we need a MRI of her right hip and left hip.  The right hip will be to assess the degree of avascular process that she has that was seen on previous CT scan.  The left hip MRI will be to evaluate for prosthetic loosening or other issues around her prosthesis such as fluid collections that are causing her to have the severe pain she is having.  We will see her back after the studies.  I have recommended better blood glucose control because with a hemoglobin A1c of over 7.8, we would not be able to perform surgery.  She understands this as well.  Follow-Up Instructions: Return in about 4 weeks (around 02/09/2020).   Orders:  Orders Placed This Encounter  Procedures  . XR Pelvis 1-2 Views   No orders of the defined types were placed in this encounter.     Procedures: No procedures performed   Clinical Data: No additional findings.   Subjective: Chief Complaint  Patient presents with  . Left Hip - Pain  . Right Hip - Pain  The patient is someone of actually seen in the past.  Is been a long period of time.  She had a total hip arthroplasty done elsewhere in 2008.  She was told her that hip was wearing out.  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  She is someone who does use a cane to ambulate.  She reports severe daily hip pain bilaterally.  She is also diabetic and does report poor diabetic control.  Apparently her hemoglobin A1c per her report was 8.4  last week.  Her pain is still daily.  I recommended in the past MRI of both hips to evaluate avascular necrosis on the right hip and to rule out prosthetic loosening or other issues with the prosthesis on her left hip.  She is taking tramadol for pain but states that it has not helped.  HPI  Review of Systems She currently denies any headache, chest pain, shortness of breath, fever, chills, nausea, vomiting  Objective: Vital Signs: Ht 5\' 3"  (1.6 m)   Wt 211 lb (95.7 kg)   BMI 37.38 kg/m   Physical Exam She is alert and orient x3 and in no acute distress Ortho Exam Examination of both hips show the move smoothly and fluidly with no blocks to rotation but she does have pain when I examined both hips. Specialty Comments:  No specialty comments available.  Imaging: XR Pelvis 1-2 Views  Result Date: 01/12/2020 A low AP pelvis shows a hip replacement left side with no evidence of acute findings or loosening.  The right hip on the AP view shows a congruent hip joint.    PMFS History: Patient Active Problem List  Diagnosis Date Noted  . History of total left hip replacement 06/03/2018  . Avascular necrosis of bone of hip, right ([place]) 06/03/2018   Past Medical History:  Diagnosis Date  . Arthritis   . Diabetes mellitus without complication ([place])   . Hypertension   . PE (pulmonary thromboembolism) ([place]) 2007    Family History  Problem Relation Age of Onset  . Cancer Mother   . Heart attack Father     Past Surgical History:  Procedure Laterality Date  . OTHER SURGICAL HISTORY  2008   left hip replacement  . PARTIAL HYSTERECTOMY    . TOTAL HIP ARTHROPLASTY     Social History   Occupational History  . Occupation: retired  Tobacco Use  . Smoking status: Never Smoker  . Smokeless tobacco: Never Used  Substance and Sexual Activity  . Alcohol use: Never  . Drug use: Never  . Sexual activity: Not on file

## 2020-01-13 ENCOUNTER — Other Ambulatory Visit: Payer: Self-pay

## 2020-01-13 DIAGNOSIS — M25551 Pain in right hip: Secondary | ICD-10-CM

## 2020-01-13 DIAGNOSIS — Z96642 Presence of left artificial hip joint: Secondary | ICD-10-CM

## 2020-01-13 DIAGNOSIS — M87051 Idiopathic aseptic necrosis of right femur: Secondary | ICD-10-CM

## 2020-01-13 DIAGNOSIS — M25552 Pain in left hip: Secondary | ICD-10-CM

## 2020-01-25 NOTE — Progress Notes (Signed)
Cardiology Office Note:   Date:  01/27/2020  NAME:  Cassandra Bridges    MRN: [id number] DOB:  [birth year]   PCP:  Georgann Housekeeper, MD  Cardiologist:  No primary care provider on file.   Referring MD: Georgann Housekeeper, MD   Chief Complaint  Patient presents with  . Congestive Heart Failure   History of Present Illness:   Cassandra Bridges is a 69 y.o. female with a hx of HFpEF, DM, HTN, HLD, PAD who presents for follow-up. Was seen 4/19 and lasix increased. Her weight is down 5 pounds.  She still has significant lower extremity edema.  She does have chronic venous insufficiency changes.  Her primary care physician is kept her on 40 mg twice daily of Lasix.  Her blood pressure is well controlled 138/62.  She still does report some lower extremity pain.  This appears to be more arthritic pain per her report.  She reports pain when she lies down at night as well as rolls over.  She does have plans to see an orthopedist.  They are going to pursue MRI of her hips.  Her diabetes number has worsened.  Her most recent A1c is 8.4.  She denies chest pain today.  She does report shortness of breath with minimal exertion.  Symptoms are no better per her report.  Problem List 1. Diabetes -A1c 8.4 2. HLD -T chol 169, TG 124, HDL 57, LDL 97 3. HTN 4. HFpEF, 60-65%, G1DD 5. PAD -50-74% stenosis mid and distal R CFA -50-74% stenosis in the mid and distal L CFA  Past Medical History: Past Medical History:  Diagnosis Date  . Arthritis   . Diabetes mellitus without complication (HCC)   . Hypertension   . PE (pulmonary thromboembolism) (HCC) 2007    Past Surgical History: Past Surgical History:  Procedure Laterality Date  . OTHER SURGICAL HISTORY  2008   left hip replacement  . PARTIAL HYSTERECTOMY    . TOTAL HIP ARTHROPLASTY      Current Medications: Current Meds  Medication Sig  . carvedilol (COREG) 12.5 MG tablet Take 1 tablet (12.5 mg total) by mouth 2 (two) times daily.  . DULoxetine  (CYMBALTA) 30 MG capsule Take 30 mg by mouth daily.  . DULoxetine (CYMBALTA) 60 MG capsule Take 60 mg by mouth daily.  . furosemide (LASIX) 40 MG tablet Take 1 tablet (40 mg total) by mouth daily. May take 1 tablet extra as needed for weight gain 3lbs in a day or 5lbs in a day or increased shortness of breath  . glipiZIDE (GLUCOTROL) 5 MG tablet Take 5 mg by mouth 2 (two) times daily before a meal.   . JARDIANCE 10 MG TABS tablet Take 10 mg by mouth daily.  Marland Kitchen ketoconazole (NIZORAL) 2 % cream Apply 1 application topically 2 (two) times daily.  Marland Kitchen lisinopril (PRINIVIL,ZESTRIL) 10 MG tablet Take 10 mg by mouth daily.  . montelukast (SINGULAIR) 10 MG tablet Take 10 mg by mouth daily.  Marland Kitchen MYRBETRIQ 50 MG TB24 tablet Take 50 mg by mouth daily.  . rosuvastatin (CRESTOR) 20 MG tablet Take 1 tablet (20 mg total) by mouth daily.  . traMADol (ULTRAM) 50 MG tablet Take 50 mg by mouth every 8 (eight) hours as needed for pain.  . [DISCONTINUED] furosemide (LASIX) 40 MG tablet Take 40 mg by mouth 2 (two) times daily.  . [DISCONTINUED] furosemide (LASIX) 40 MG tablet Take 1 tablet (40 mg total) by mouth daily. May take 1 tablet extra  as needed for weight gain 3lbs in a day or 5lbs in a day     Allergies:    Effexor [venlafaxine] and Penicillins   Social History: Social History   Socioeconomic History  . Marital status: Divorced    Spouse name: Not on file  . Number of children: 2  . Years of education: Not on file  . Highest education level: Not on file  Occupational History  . Occupation: retired  Tobacco Use  . Smoking status: Never Smoker  . Smokeless tobacco: Never Used  Substance and Sexual Activity  . Alcohol use: Never  . Drug use: Never  . Sexual activity: Not on file  Other Topics Concern  . Not on file  Social History Narrative  . Not on file   Social Determinants of Health   Financial Resource Strain:   . Difficulty of Paying Living Expenses:   Food Insecurity:   . Worried  About Programme researcher, broadcasting/film/video in the Last Year:   . Barista in the Last Year:   Transportation Needs:   . Freight forwarder (Medical):   Marland Kitchen Lack of Transportation (Non-Medical):   Physical Activity:   . Days of Exercise per Week:   . Minutes of Exercise per Session:   Stress:   . Feeling of Stress :   Social Connections:   . Frequency of Communication with Friends and Family:   . Frequency of Social Gatherings with Friends and Family:   . Attends Religious Services:   . Active Member of Clubs or Organizations:   . Attends Banker Meetings:   Marland Kitchen Marital Status:      Family History: The patient's family history includes Cancer in her mother; Heart attack in her father.  ROS:   All other ROS reviewed and negative. Pertinent positives noted in the HPI.     EKGs/Labs/Other Studies Reviewed:   The following studies were personally reviewed by me today:  TTE 09/26/2019  1. Left ventricular ejection fraction, by visual estimation, is 60 to  65%. The left ventricle has normal function. There is mildly increased  left ventricular hypertrophy.  2. Elevated left atrial pressure.  3. Left ventricular diastolic parameters are consistent with Grade I  diastolic dysfunction (impaired relaxation).  4. The left ventricle has no regional wall motion abnormalities.  5. Global right ventricle has normal systolic function.The right  ventricular size is normal.  6. Left atrial size was normal.  7. Right atrial size was normal.  8. Mild mitral annular calcification.  9. The mitral valve is normal in structure. Mild mitral valve  regurgitation. No evidence of mitral stenosis.  10. The tricuspid valve is normal in structure. Tricuspid valve  regurgitation is trivial.  11. The aortic valve is tricuspid. Aortic valve regurgitation is not  visualized. Mild aortic valve sclerosis without stenosis.  12. The pulmonic valve was not well visualized. Pulmonic valve    regurgitation is not visualized.  13. The inferior vena cava is normal in size with greater than 50%  respiratory variability, suggesting right atrial pressure of 3 mmHg.  14. Vigorous LV systolic function; mild LVH; grade 1 diastolic dysfunction  with elevated filling pressure; mild MR.   VASC Art LE 12/04/2019 Summary:  Right: 50-74 % stenosis in the mid and distal CFA.  50-74% stenosis at the ostial SFA, low end range.  Three vessel run-off.   Left: 50-74 % stenosis in the mid and distal CFA.  30-49% stenosis at the  ostial SFA.  Three vessel run-off.   Recent Labs: 09/16/2019: TSH 2.450 12/22/2019: BNP 24.2   Recent Lipid Panel No results found for: CHOL, TRIG, HDL, CHOLHDL, VLDL, LDLCALC, LDLDIRECT  Physical Exam:   VS:  BP 138/62   Pulse 79   Ht 5\' 3"  (1.6 m)   Wt 210 lb (95.3 kg)   SpO2 95%   BMI 37.20 kg/m    Wt Readings from Last 3 Encounters:  01/27/20 210 lb (95.3 kg)  01/12/20 211 lb (95.7 kg)  12/22/19 214 lb (97.1 kg)    General: Well nourished, well developed, in no acute distress Heart: Atraumatic, normal size  Eyes: PEERLA, EOMI  Neck: Supple, no JVD Endocrine: No thryomegaly Cardiac: Normal S1, S2; RRR; no murmurs, rubs, or gallops Lungs: Clear to auscultation bilaterally, no wheezing, rhonchi or rales  Abd: Soft, nontender, no hepatomegaly  Ext: 1+ lower extremity edema, chronic venous insufficiency changes noted Musculoskeletal: No deformities, BUE and BLE strength normal and equal Skin: Warm and dry, no rashes   Neuro: Alert and oriented to person, place, time, and situation, CNII-XII grossly intact, no focal deficits  Psych: Normal mood and affect   ASSESSMENT:   Cassandra Bridges is a 69 y.o. female who presents for the following: 1. Chronic diastolic heart failure (HCC)   2. SOB (shortness of breath) on exertion   3. Venous insufficiency   4. PAD (peripheral artery disease) (HCC)   5. Essential hypertension   6. Mixed hyperlipidemia      PLAN:   1. Chronic diastolic heart failure (HCC) 2. SOB (shortness of breath) on exertion -Symptoms appear to be stable.  Weights are stable.  We will continue with Lasix 40 mg twice daily.  Blood pressure better controlled today.  Continue Coreg 12.5 mg twice daily as well as lisinopril 10 mg daily.  She will continue to work on her diabetes as well as exercise a bit more.  Also needs to work on her venous insufficiency.  I recommended compression stockings see below.  3. Venous insufficiency -She has evidence of venous insufficiency on exam.  I have instructed her to wear compression stockings as well as leg elevation.  I think this will help.  Her leg edema is improved since her last visit.  4. PAD (peripheral artery disease) (HCC) -Mild to moderate PAD bilaterally.  She does not really have symptoms of claudication.  For now we will continue to monitor this.  5. Essential hypertension -Well-controlled continue current medications.  6. Mixed hyperlipidemia -Continue Crestor 20 mg daily.  Disposition: Return in about 6 months (around 07/29/2020).  Medication Adjustments/Labs and Tests Ordered: Current medicines are reviewed at length with the patient today.  Concerns regarding medicines are outlined above.  No orders of the defined types were placed in this encounter.  Meds ordered this encounter  Medications  . DISCONTD: furosemide (LASIX) 40 MG tablet    Sig: Take 1 tablet (40 mg total) by mouth daily. May take 1 tablet extra as needed for weight gain 3lbs in a day or 5lbs in a day    Dispense:  90 tablet    Refill:  1  . furosemide (LASIX) 40 MG tablet    Sig: Take 1 tablet (40 mg total) by mouth daily. May take 1 tablet extra as needed for weight gain 3lbs in a day or 5lbs in a day or increased shortness of breath    Dispense:  90 tablet    Refill:  1  Patient Instructions  Medication Instructions:  Take Lasix 40 mg daily- may use 1 tablet extra as needed for  swelling or increase shortness of breath   *If you need a refill on your cardiac medications before your next appointment, please call your pharmacy*   Follow-Up: At [facility], you and your health needs are our priority.  As part of our continuing mission to provide you with exceptional heart care, we have created designated Provider Care Teams.  These Care Teams include your primary Cardiologist (physician) and Advanced Practice Providers (APPs -  Physician Assistants and Nurse Practitioners) who all work together to provide you with the care you need, when you need it.  We recommend signing up for the patient portal called "MyChart".  Sign up information is provided on this After Visit Summary.  MyChart is used to connect with patients for Virtual Visits (Telemedicine).  Patients are able to view lab/test results, encounter notes, upcoming appointments, etc.  Non-urgent messages can be sent to your provider as well.   To learn more about what you can do with MyChart, go to [email or web address].    Your next appointment:   6 month(s)  The format for your next appointment:   In Person  Provider:   Eleonore Chiquito, MD         Time Spent with Patient: I have spent a total of 25 minutes with patient reviewing hospital notes, telemetry, EKGs, labs and examining the patient as well as establishing an assessment and plan that was discussed with the patient.  > 50% of time was spent in direct patient care.  Signed, Addison Naegeli. Audie Box, [place]  [address] [place], [place] [address] [phone]  01/27/2020 5:00 PM

## 2020-01-27 ENCOUNTER — Ambulatory Visit (INDEPENDENT_AMBULATORY_CARE_PROVIDER_SITE_OTHER): Payer: Medicare Other | Admitting: Cardiovascular Disease

## 2020-01-27 ENCOUNTER — Encounter: Payer: Self-pay | Admitting: Cardiovascular Disease

## 2020-01-27 ENCOUNTER — Other Ambulatory Visit: Payer: Self-pay

## 2020-01-27 VITALS — BP 138/62 | HR 79 | Ht 63.0 in | Wt 210.0 lb

## 2020-01-27 DIAGNOSIS — I872 Venous insufficiency (chronic) (peripheral): Secondary | ICD-10-CM | POA: Diagnosis not present

## 2020-01-27 DIAGNOSIS — I5032 Chronic diastolic (congestive) heart failure: Secondary | ICD-10-CM

## 2020-01-27 DIAGNOSIS — R0602 Shortness of breath: Secondary | ICD-10-CM

## 2020-01-27 DIAGNOSIS — I10 Essential (primary) hypertension: Secondary | ICD-10-CM

## 2020-01-27 DIAGNOSIS — I739 Peripheral vascular disease, unspecified: Secondary | ICD-10-CM | POA: Diagnosis not present

## 2020-01-27 DIAGNOSIS — E782 Mixed hyperlipidemia: Secondary | ICD-10-CM

## 2020-01-27 MED ORDER — FUROSEMIDE 40 MG PO TABS: 40.0000 mg | ORAL_TABLET | Freq: Every day | ORAL | 1 refills | Status: DC

## 2020-01-27 NOTE — Patient Instructions (Addendum)
Medication Instructions:  Take Lasix 40 mg daily- may use 1 tablet extra as needed for swelling or increase shortness of breath   *If you need a refill on your cardiac medications before your next appointment, please call your pharmacy*   Follow-Up: At [facility], you and your health needs are our priority.  As part of our continuing mission to provide you with exceptional heart care, we have created designated Provider Care Teams.  These Care Teams include your primary Cardiologist (physician) and Advanced Practice Providers (APPs -  Physician Assistants and Nurse Practitioners) who all work together to provide you with the care you need, when you need it.  We recommend signing up for the patient portal called "MyChart".  Sign up information is provided on this After Visit Summary.  MyChart is used to connect with patients for Virtual Visits (Telemedicine).  Patients are able to view lab/test results, encounter notes, upcoming appointments, etc.  Non-urgent messages can be sent to your provider as well.   To learn more about what you can do with MyChart, go to [email or web address].    Your next appointment:   6 month(s)  The format for your next appointment:   In Person  Provider:   Lennie Odor, MD

## 2020-02-04 ENCOUNTER — Other Ambulatory Visit: Payer: Self-pay

## 2020-02-04 MED ORDER — FUROSEMIDE 40 MG PO TABS: 40.0000 mg | ORAL_TABLET | Freq: Every day | ORAL | 1 refills | Status: DC

## 2020-02-05 ENCOUNTER — Other Ambulatory Visit: Payer: Self-pay | Admitting: *Deleted

## 2020-02-05 MED ORDER — FUROSEMIDE 40 MG PO TABS: 40.0000 mg | ORAL_TABLET | Freq: Every day | ORAL | 2 refills | Status: DC

## 2020-02-05 NOTE — Telephone Encounter (Signed)
Rx has been sent to the pharmacy electronically. ° °

## 2020-02-09 ENCOUNTER — Ambulatory Visit: Payer: Medicare Other | Admitting: Orthopaedic Surgery

## 2020-02-10 ENCOUNTER — Other Ambulatory Visit: Payer: Self-pay

## 2020-02-10 ENCOUNTER — Telehealth: Payer: Self-pay | Admitting: Cardiovascular Disease

## 2020-02-10 NOTE — Telephone Encounter (Signed)
Spoke with the [facility] and they noted call has already been handled by Wynona Canes in the office. Will close out the message.

## 2020-02-10 NOTE — Telephone Encounter (Signed)
Pt c/o medication issue:  1. Name of Medication: furosemide (LASIX) 40 MG tablet  2. How are you currently taking this medication (dosage and times per day)? Has not started  3. Are you having a reaction (difficulty breathing--STAT)? no  4. What is your medication issue? Chelsea from [employer] calling requesting clarification on the directions for the medication. She states it has been pending since 6/2.

## 2020-02-12 ENCOUNTER — Other Ambulatory Visit: Payer: Medicare Other

## 2020-02-17 ENCOUNTER — Ambulatory Visit: Payer: Medicare Other | Admitting: Physician Assistant

## 2020-03-20 ENCOUNTER — Ambulatory Visit
Admission: RE | Admit: 2020-03-20 | Discharge: 2020-03-20 | Disposition: A | Discharge status: Home / Self Care | Payer: Medicare Other | Source: Ambulatory Visit | Attending: Orthopaedic Surgery | Admitting: Orthopaedic Surgery

## 2020-03-20 DIAGNOSIS — M25552 Pain in left hip: Secondary | ICD-10-CM

## 2020-03-20 DIAGNOSIS — M25551 Pain in right hip: Secondary | ICD-10-CM

## 2020-03-20 DIAGNOSIS — M87051 Idiopathic aseptic necrosis of right femur: Secondary | ICD-10-CM

## 2020-03-20 DIAGNOSIS — Z96642 Presence of left artificial hip joint: Secondary | ICD-10-CM

## 2020-03-23 ENCOUNTER — Ambulatory Visit: Payer: Medicare Other | Admitting: Physician Assistant

## 2020-03-24 ENCOUNTER — Other Ambulatory Visit: Payer: Self-pay

## 2020-03-24 ENCOUNTER — Encounter: Payer: Self-pay | Admitting: Physician Assistant

## 2020-03-24 ENCOUNTER — Ambulatory Visit (INDEPENDENT_AMBULATORY_CARE_PROVIDER_SITE_OTHER): Payer: Medicare Other | Admitting: Physician Assistant

## 2020-03-24 VITALS — Ht 63.0 in | Wt 210.0 lb

## 2020-03-24 DIAGNOSIS — M25551 Pain in right hip: Secondary | ICD-10-CM | POA: Diagnosis not present

## 2020-03-24 DIAGNOSIS — M87051 Idiopathic aseptic necrosis of right femur: Secondary | ICD-10-CM | POA: Diagnosis not present

## 2020-03-24 NOTE — Progress Notes (Signed)
Office Visit Note   Patient: Cassandra Bridges           Date of Birth: [birth year]           MRN: [id number] Visit Date: 03/24/2020              Requested by: Georgann Housekeeper, MD [address] [place],  [place] [address] PCP: Georgann Housekeeper, MD   Assessment & Plan: Visit Diagnoses:  1. Pain in right hip   2. Avascular necrosis of bone of hip, right (HCC)     Plan: Given patient's avascular necrosis of right hip would not recommend intra-articular injection.  Also patient reports that her diabetes is poorly controlled.  Patient has a history of pulmonary emboli in the past and had to have a stent placed prior to undergoing left total hip surgery.  She is currently on no anticoagulation.  She would need to discuss with her cardiologist prior to any scheduling of surgery.  She would like to discuss surgery of the right hip in approximately 2 months.  Questions were encouraged and answered at length.  We will see her back in 2 months.  I also discussed with her that her diabetes would need to be under good control prior to any surgery.  Follow-Up Instructions: Return in about 2 months (around 05/25/2020).   Orders:  No orders of the defined types were placed in this encounter.  No orders of the defined types were placed in this encounter.     Procedures: No procedures performed   Clinical Data: No additional findings.   Subjective: Chief Complaint  Patient presents with  . Left Hip - Pain    MRI Review  . Right Hip - Pain    HPI  Returns today for bilateral hip pain.  She underwent an MRI of both hips on 03/20/2020.  She continues to have pain in both hips at this point time.  She has had no new injury.  MRI results were reviewed with the patient along with images.  MRI of the left hip showed no acute fracture or hardware complications.  Right hip was found to have mild chronic avascular necrosis changes in the femoral head were unchanged from prior exam.  Mild  arthritic changes right hip.  Review of Systems No fevers chills shortness breath chest pain.  Objective: Vital Signs: Ht 5\' 3"  (1.6 m)   Wt 210 lb (95.3 kg)   BMI 37.20 kg/m   Physical Exam General: Well-developed well-nourished female no acute distress ambulates with a slight antalgic gait with use of walking stick. Ortho Exam Bilateral hips left hip excellent range of motion without pain.  Right hip pain with internal and external rotation but fluid motion.  Bilateral knees positive crepitus with flexion-extension but overall good range of motion.  No abnormal warmth erythema or effusion of either knee. Specialty Comments:  No specialty comments available.  Imaging: No results found.   PMFS History: Patient Active Problem List   Diagnosis Date Noted  . History of total left hip replacement 06/03/2018  . Avascular necrosis of bone of hip, right (HCC) 06/03/2018   Past Medical History:  Diagnosis Date  . Arthritis   . Diabetes mellitus without complication (HCC)   . Hypertension   . PE (pulmonary thromboembolism) (HCC) 2007    Family History  Problem Relation Age of Onset  . Cancer Mother   . Heart attack Father     Past Surgical History:  Procedure Laterality  Date  . OTHER SURGICAL HISTORY  2008   left hip replacement  . PARTIAL HYSTERECTOMY    . TOTAL HIP ARTHROPLASTY     Social History   Occupational History  . Occupation: retired  Tobacco Use  . Smoking status: Never Smoker  . Smokeless tobacco: Never Used  Vaping Use  . Vaping Use: Never used  Substance and Sexual Activity  . Alcohol use: Never  . Drug use: Never  . Sexual activity: Not on file

## 2020-05-26 ENCOUNTER — Ambulatory Visit: Payer: Medicare Other | Admitting: Physician Assistant

## 2020-07-22 ENCOUNTER — Encounter: Payer: Self-pay | Admitting: Cardiovascular Disease

## 2020-07-22 ENCOUNTER — Other Ambulatory Visit: Payer: Self-pay

## 2020-07-22 ENCOUNTER — Ambulatory Visit: Payer: Medicare Other | Admitting: Cardiovascular Disease

## 2020-07-22 VITALS — BP 81/46 | HR 93 | Ht 63.0 in | Wt 186.0 lb

## 2020-07-22 DIAGNOSIS — I5032 Chronic diastolic (congestive) heart failure: Secondary | ICD-10-CM

## 2020-07-22 DIAGNOSIS — R0602 Shortness of breath: Secondary | ICD-10-CM

## 2020-07-22 DIAGNOSIS — E782 Mixed hyperlipidemia: Secondary | ICD-10-CM

## 2020-07-22 DIAGNOSIS — I10 Essential (primary) hypertension: Secondary | ICD-10-CM | POA: Diagnosis not present

## 2020-07-22 DIAGNOSIS — I739 Peripheral vascular disease, unspecified: Secondary | ICD-10-CM | POA: Diagnosis not present

## 2020-07-22 NOTE — Patient Instructions (Signed)
Medication Instructions:  Stop taking Carvedilol 12.5 mg twice a day and Lisinopril 10 mg daily.  *If you need a refill on your cardiac medications before your next appointment, please call your pharmacy*   Lab Work: None   Testing/Procedures: None   Follow-Up: At [facility], you and your health needs are our priority.  As part of our continuing mission to provide you with exceptional heart care, we have created designated Provider Care Teams.  These Care Teams include your primary Cardiologist (physician) and Advanced Practice Providers (APPs -  Physician Assistants and Nurse Practitioners) who all work together to provide you with the care you need, when you need it.  We recommend signing up for the patient portal called "MyChart".  Sign up information is provided on this After Visit Summary.  MyChart is used to connect with patients for Virtual Visits (Telemedicine).  Patients are able to view lab/test results, encounter notes, upcoming appointments, etc.  Non-urgent messages can be sent to your provider as well.   To learn more about what you can do with MyChart, go to [email or web address].    Your next appointment:   6 month(s)  The format for your next appointment:   In Person  Provider:   Lennie Odor, MD

## 2020-07-22 NOTE — Progress Notes (Signed)
Cardiology Office Note:   Date:  07/22/2020  NAME:  Cassandra Bridges    MRN: [id number] DOB:  1951-03-07   PCP:  Georgann Housekeeper, MD  Cardiologist:  No primary care provider on file.   Referring MD: Georgann Housekeeper, MD   Chief Complaint  Patient presents with  . Follow-up    History of Present Illness:   Cassandra Bridges is a 69 y.o. female with a hx of HFpEF, DM, HTN, PAD who presents for follow-up.  She is doing quite well since her last visit.  She is lost 24 pounds.  She is eating less and trying to do some exercise.  She does suffer with arthritis and PAD in the lower extremities.  She describes no worsening claudication symptoms.  Her blood pressure is quite low today 90/50 on my check.  She is remained on 2 blood pressure medications and given her recent weight loss I suspect she needs to come off of these.  Despite her low blood pressure, she describes no lightheadedness or dizziness.  She not had any passing out spells.  No fevers chills or cough.  She still can get short of breath with exertion but things are much improved.  She is taking Lasix as needed.  Lab work from her primary care physician shows her diabetes is improved with an A1c of 6.7.  Cholesterol is drastically improved as well.  She denies any chest pain, shortness of breath or palpitations in office.  Problem List 1. Diabetes -A1c 6.7 2. HLD -T chol 135, HDL 60, LDL 59, triglycerides 80 3. HTN 4. HFpEF, 60-65%, G1DD 5. PAD -50-74% stenosis mid and distal R CFA -50-74% stenosis in the mid and distal L CFA  Past Medical History: Past Medical History:  Diagnosis Date  . Arthritis   . Diabetes mellitus without complication (HCC)   . Hypertension   . PE (pulmonary thromboembolism) (HCC) 2007    Past Surgical History: Past Surgical History:  Procedure Laterality Date  . OTHER SURGICAL HISTORY  2008   left hip replacement  . PARTIAL HYSTERECTOMY    . TOTAL HIP ARTHROPLASTY      Current  Medications: Current Meds  Medication Sig  . albuterol (VENTOLIN HFA) 108 (90 Base) MCG/ACT inhaler   . furosemide (LASIX) 40 MG tablet Take 1 tablet (40 mg total) by mouth daily. May take 1 tablet extra as needed for weight gain  . glipiZIDE (GLUCOTROL) 5 MG tablet Take 5 mg by mouth 2 (two) times daily before a meal.   . JARDIANCE 10 MG TABS tablet Take 10 mg by mouth daily.  Marland Kitchen ketoconazole (NIZORAL) 2 % cream Apply 1 application topically 2 (two) times daily.  . montelukast (SINGULAIR) 10 MG tablet Take 10 mg by mouth daily.  Marland Kitchen MYRBETRIQ 50 MG TB24 tablet Take 50 mg by mouth daily.  . traMADol (ULTRAM) 50 MG tablet Take 50 mg by mouth every 8 (eight) hours as needed for pain.  . [DISCONTINUED] lisinopril (PRINIVIL,ZESTRIL) 10 MG tablet Take 10 mg by mouth daily.     Allergies:    Effexor [venlafaxine] and Penicillins   Social History: Social History   Socioeconomic History  . Marital status: Divorced    Spouse name: Not on file  . Number of children: 2  . Years of education: Not on file  . Highest education level: Not on file  Occupational History  . Occupation: retired  Tobacco Use  . Smoking status: Never Smoker  . Smokeless tobacco: Never  Used  Vaping Use  . Vaping Use: Never used  Substance and Sexual Activity  . Alcohol use: Never  . Drug use: Never  . Sexual activity: Not on file  Other Topics Concern  . Not on file  Social History Narrative  . Not on file   Social Determinants of Health   Financial Resource Strain:   . Difficulty of Paying Living Expenses: Not on file  Food Insecurity:   . Worried About Programme researcher, broadcasting/film/video in the Last Year: Not on file  . Ran Out of Food in the Last Year: Not on file  Transportation Needs:   . Lack of Transportation (Medical): Not on file  . Lack of Transportation (Non-Medical): Not on file  Physical Activity:   . Days of Exercise per Week: Not on file  . Minutes of Exercise per Session: Not on file  Stress:   .  Feeling of Stress : Not on file  Social Connections:   . Frequency of Communication with Friends and Family: Not on file  . Frequency of Social Gatherings with Friends and Family: Not on file  . Attends Religious Services: Not on file  . Active Member of Clubs or Organizations: Not on file  . Attends Banker Meetings: Not on file  . Marital Status: Not on file     Family History: The patient's family history includes Cancer in her mother; Heart attack in her father.  ROS:   All other ROS reviewed and negative. Pertinent positives noted in the HPI.     EKGs/Labs/Other Studies Reviewed:   The following studies were personally reviewed by me today:  Recent Labs: 09/16/2019: TSH 2.450 12/22/2019: BNP 24.2   Recent Lipid Panel No results found for: CHOL, TRIG, HDL, CHOLHDL, VLDL, LDLCALC, LDLDIRECT  Physical Exam:   VS:  BP (!) 81/46 (BP Location: Right Arm, Patient Position: Sitting)   Pulse 93   Ht 5\' 3"  (1.6 m)   Wt 186 lb (84.4 kg)   SpO2 96%   BMI 32.95 kg/m    Wt Readings from Last 3 Encounters:  07/22/20 186 lb (84.4 kg)  03/24/20 210 lb (95.3 kg)  01/27/20 210 lb (95.3 kg)    General: Well nourished, well developed, in no acute distress Heart: Atraumatic, normal size  Eyes: PEERLA, EOMI  Neck: Supple, no JVD Endocrine: No thryomegaly Cardiac: Normal S1, S2; RRR; no murmurs, rubs, or gallops Lungs: Clear to auscultation bilaterally, no wheezing, rhonchi or rales  Abd: Soft, nontender, no hepatomegaly  Ext: Trace edema Musculoskeletal: No deformities, BUE and BLE strength normal and equal Skin: Warm and dry, no rashes   Neuro: Alert and oriented to person, place, time, and situation, CNII-XII grossly intact, no focal deficits  Psych: Normal mood and affect   ASSESSMENT:   Cassandra Bridges is a 69 y.o. female who presents for the following: 1. Chronic diastolic heart failure (HCC)   2. SOB (shortness of breath) on exertion   3. PAD (peripheral  artery disease) (HCC)   4. Essential hypertension   5. Mixed hyperlipidemia     PLAN:   1. Chronic diastolic heart failure (HCC) 2. SOB (shortness of breath) on exertion -Symptoms have drastically improved with weight loss.  Blood pressure is a bit too low.  We will stop her blood pressure medications.  She will continue with Lasix 40 mg daily as needed.  She seems to be doing remarkably well after losing weight.  I congratulated her on this.  3. PAD (peripheral artery disease) (HCC) -50-74% stenosis mid and distal R CFA -50-74% stenosis in the mid and distal L CFA -She does have moderate bilateral PAD.  No major symptoms.  Symptoms apparently improved with Lasix and removal of lower extremity edema.  We will continue to monitor for now.  She is on aspirin and statin.  LDL cholesterol is at goal.  4. Essential hypertension -Blood pressure quite low today.  She has lost 24 pounds.  I highly suspect she no longer needs to be on blood pressure medications.  We will stop her lisinopril and her Coreg.  She is worked on her diet and is doing well.  5. Mixed hyperlipidemia -She is diabetic and has PAD.  Most recent LDL cholesterol 59.  She will continue Crestor 20 mg daily.  Disposition: Return in about 6 months (around 01/19/2021).  Medication Adjustments/Labs and Tests Ordered: Current medicines are reviewed at length with the patient today.  Concerns regarding medicines are outlined above.  No orders of the defined types were placed in this encounter.  No orders of the defined types were placed in this encounter.   Patient Instructions  Medication Instructions:  Stop taking Carvedilol 12.5 mg twice a day and Lisinopril 10 mg daily.  *If you need a refill on your cardiac medications before your next appointment, please call your pharmacy*   Lab Work: None   Testing/Procedures: None   Follow-Up: At [facility], you and your health needs are our priority.  As part of our  continuing mission to provide you with exceptional heart care, we have created designated Provider Care Teams.  These Care Teams include your primary Cardiologist (physician) and Advanced Practice Providers (APPs -  Physician Assistants and Nurse Practitioners) who all work together to provide you with the care you need, when you need it.  We recommend signing up for the patient portal called "MyChart".  Sign up information is provided on this After Visit Summary.  MyChart is used to connect with patients for Virtual Visits (Telemedicine).  Patients are able to view lab/test results, encounter notes, upcoming appointments, etc.  Non-urgent messages can be sent to your provider as well.   To learn more about what you can do with MyChart, go to [email or web address].    Your next appointment:   6 month(s)  The format for your next appointment:   In Person  Provider:   Lennie Odor, MD       Time Spent with Patient: I have spent a total of 25 minutes with patient reviewing hospital notes, telemetry, EKGs, labs and examining the patient as well as establishing an assessment and plan that was discussed with the patient.  > 50% of time was spent in direct patient care.  Signed, Lenna Gilford. Flora Lipps, MD [facility]  [address], [address] [place], [place] [address] [phone]  07/22/2020 7:00 PM

## 2020-09-13 ENCOUNTER — Other Ambulatory Visit: Payer: Self-pay | Admitting: Cardiovascular Disease

## 2020-11-24 DIAGNOSIS — E1140 Type 2 diabetes mellitus with diabetic neuropathy, unspecified: Secondary | ICD-10-CM | POA: Diagnosis not present

## 2020-11-24 DIAGNOSIS — Z7984 Long term (current) use of oral hypoglycemic drugs: Secondary | ICD-10-CM | POA: Diagnosis not present

## 2020-11-24 DIAGNOSIS — I872 Venous insufficiency (chronic) (peripheral): Secondary | ICD-10-CM | POA: Diagnosis not present

## 2020-11-24 DIAGNOSIS — J45909 Unspecified asthma, uncomplicated: Secondary | ICD-10-CM | POA: Diagnosis not present

## 2020-11-24 DIAGNOSIS — M169 Osteoarthritis of hip, unspecified: Secondary | ICD-10-CM | POA: Diagnosis not present

## 2020-11-24 DIAGNOSIS — G8929 Other chronic pain: Secondary | ICD-10-CM | POA: Diagnosis not present

## 2020-11-24 DIAGNOSIS — I10 Essential (primary) hypertension: Secondary | ICD-10-CM | POA: Diagnosis not present

## 2020-11-24 DIAGNOSIS — I5032 Chronic diastolic (congestive) heart failure: Secondary | ICD-10-CM | POA: Diagnosis not present

## 2020-11-24 DIAGNOSIS — R0609 Other forms of dyspnea: Secondary | ICD-10-CM | POA: Diagnosis not present

## 2021-01-12 DIAGNOSIS — I872 Venous insufficiency (chronic) (peripheral): Secondary | ICD-10-CM | POA: Diagnosis not present

## 2021-01-12 DIAGNOSIS — R350 Frequency of micturition: Secondary | ICD-10-CM | POA: Diagnosis not present

## 2021-01-12 DIAGNOSIS — I10 Essential (primary) hypertension: Secondary | ICD-10-CM | POA: Diagnosis not present

## 2021-03-21 DIAGNOSIS — H524 Presbyopia: Secondary | ICD-10-CM | POA: Diagnosis not present

## 2021-03-21 DIAGNOSIS — E119 Type 2 diabetes mellitus without complications: Secondary | ICD-10-CM | POA: Diagnosis not present

## 2021-04-19 DIAGNOSIS — H2511 Age-related nuclear cataract, right eye: Secondary | ICD-10-CM | POA: Diagnosis not present

## 2021-04-28 DIAGNOSIS — E1140 Type 2 diabetes mellitus with diabetic neuropathy, unspecified: Secondary | ICD-10-CM | POA: Diagnosis not present

## 2021-04-28 DIAGNOSIS — I5032 Chronic diastolic (congestive) heart failure: Secondary | ICD-10-CM | POA: Diagnosis not present

## 2021-04-28 DIAGNOSIS — E785 Hyperlipidemia, unspecified: Secondary | ICD-10-CM | POA: Diagnosis not present

## 2021-04-28 DIAGNOSIS — Z0000 Encounter for general adult medical examination without abnormal findings: Secondary | ICD-10-CM | POA: Diagnosis not present

## 2021-04-28 DIAGNOSIS — J45909 Unspecified asthma, uncomplicated: Secondary | ICD-10-CM | POA: Diagnosis not present

## 2021-04-28 DIAGNOSIS — I872 Venous insufficiency (chronic) (peripheral): Secondary | ICD-10-CM | POA: Diagnosis not present

## 2021-04-28 DIAGNOSIS — Z1389 Encounter for screening for other disorder: Secondary | ICD-10-CM | POA: Diagnosis not present

## 2021-04-28 DIAGNOSIS — I10 Essential (primary) hypertension: Secondary | ICD-10-CM | POA: Diagnosis not present

## 2021-04-28 DIAGNOSIS — M169 Osteoarthritis of hip, unspecified: Secondary | ICD-10-CM | POA: Diagnosis not present

## 2021-04-28 DIAGNOSIS — Z1159 Encounter for screening for other viral diseases: Secondary | ICD-10-CM | POA: Diagnosis not present

## 2021-05-05 DIAGNOSIS — H2511 Age-related nuclear cataract, right eye: Secondary | ICD-10-CM | POA: Diagnosis not present

## 2021-05-05 DIAGNOSIS — Z01818 Encounter for other preprocedural examination: Secondary | ICD-10-CM | POA: Diagnosis not present

## 2021-05-05 DIAGNOSIS — H2512 Age-related nuclear cataract, left eye: Secondary | ICD-10-CM | POA: Diagnosis not present

## 2021-05-05 DIAGNOSIS — H02831 Dermatochalasis of right upper eyelid: Secondary | ICD-10-CM | POA: Diagnosis not present

## 2021-05-13 DIAGNOSIS — H2511 Age-related nuclear cataract, right eye: Secondary | ICD-10-CM | POA: Diagnosis not present

## 2021-05-27 DIAGNOSIS — H25812 Combined forms of age-related cataract, left eye: Secondary | ICD-10-CM | POA: Diagnosis not present

## 2021-05-27 DIAGNOSIS — H2512 Age-related nuclear cataract, left eye: Secondary | ICD-10-CM | POA: Diagnosis not present

## 2021-07-15 NOTE — Progress Notes (Signed)
Cardiology Office Note:   Date:  07/18/2021  NAME:  Cassandra Bridges    MRN: [id number] DOB:  Nov 17, 1950   PCP:  Georgann Housekeeper, MD  Cardiologist:  None  Electrophysiologist:  None   Referring MD: Georgann Housekeeper, MD   Chief Complaint  Patient presents with   Follow-up   History of Present Illness:   Cassandra Bridges is a 70 y.o. female with a hx of diastolic heart failure, PAD, hypertension who presents for follow-up.  She reports she is overall doing well.  BP 120/64.  Denies any increased lower extremity edema.  She continues to take Lasix 40 mg daily with an extra tablet as needed.  She is on Jardiance 10 mg daily.  Weights are stable.  She is 196 pounds.  A1c is 7.0.  LDL cholesterol 97.  Close to goal.  She was at goal last year.  A1c is increased and I suspect this is coincided with her cholesterol increase.  She reports no significant claudication symptoms.  No significant lower extreme edema.  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  No structured exercise.  Overall seems to be doing quite well.  Cholesterol seems to be a bit lower.  On aspirin no bleeding.  Problem List 1. Diabetes -A1c 6.7 2. HLD -T chol 135, HDL 60, LDL 59, triglycerides 80 3. HTN 4. HFpEF, 60-65%, G1DD 5. PAD -50-74% stenosis mid and distal R CFA -50-74% stenosis in the mid and distal L CFA 6. PE -2007  Past Medical History: Past Medical History:  Diagnosis Date   Arthritis    Diabetes mellitus without complication (HCC)    Hypertension    PE (pulmonary thromboembolism) (HCC) 2007    Past Surgical History: Past Surgical History:  Procedure Laterality Date   OTHER SURGICAL HISTORY  2008   left hip replacement   PARTIAL HYSTERECTOMY     TOTAL HIP ARTHROPLASTY      Current Medications: Current Meds  Medication Sig   albuterol (VENTOLIN HFA) 108 (90 Base) MCG/ACT inhaler    furosemide (LASIX) 40 MG tablet TAKE  1 TABLET BY MOUTH  DAILY . MAY TAKE 1 EXTRA  TAB AS NEEDED FOR WEIGHT  GAIN OR SHORTNESS OF  BREATH. MAX 2 TABS PER DAY   glipiZIDE (GLUCOTROL) 5 MG tablet Take 5 mg by mouth 2 (two) times daily before a meal.    JARDIANCE 10 MG TABS tablet Take 10 mg by mouth daily.   ketoconazole (NIZORAL) 2 % cream Apply 1 application topically 2 (two) times daily.   montelukast (SINGULAIR) 10 MG tablet Take 10 mg by mouth daily.   traMADol (ULTRAM) 50 MG tablet Take 50 mg by mouth every 8 (eight) hours as needed for pain.   [DISCONTINUED] rosuvastatin (CRESTOR) 20 MG tablet TAKE 1 TABLET BY MOUTH  DAILY     Allergies:    Effexor [venlafaxine] and Penicillins   Social History: Social History   Socioeconomic History   Marital status: Divorced    Spouse name: Not on file   Number of children: 2   Years of education: Not on file   Highest education level: Not on file  Occupational History   Occupation: retired  Tobacco Use   Smoking status: Never   Smokeless tobacco: Never  Vaping Use   Vaping Use: Never used  Substance and Sexual Activity   Alcohol use: Never   Drug use: Never  Sexual activity: Not on file  Other Topics Concern   Not on file  Social History Narrative   Not on file   Social Determinants of Health   Financial Resource Strain: Not on file  Food Insecurity: Not on file  Transportation Needs: Not on file  Physical Activity: Not on file  Stress: Not on file  Social Connections: Not on file     Family History: The patient's family history includes Cancer in her mother; Heart attack in her father.  ROS:   All other ROS reviewed and negative. Pertinent positives noted in the HPI.     EKGs/Labs/Other Studies Reviewed:   The following studies were personally reviewed by me today:  NM Stress 09/24/2019 1. Normal myocardial perfusion imaging study without evidence of ischemia or infarction.  2. Normal LVEF, >65%.  3. Prominent RV noted, which is commonly seen in setting  of pulmonary hypertension.  4. This is a low-risk study.   TTE 09/26/2019 1. Left ventricular ejection fraction, by visual estimation, is 60 to  65%. The left ventricle has normal function. There is mildly increased  left ventricular hypertrophy.   2. Elevated left atrial pressure.   3. Left ventricular diastolic parameters are consistent with Grade I  diastolic dysfunction (impaired relaxation).   4. The left ventricle has no regional wall motion abnormalities.   5. Global right ventricle has normal systolic function.The right  ventricular size is normal.   6. Left atrial size was normal.   7. Right atrial size was normal.   8. Mild mitral annular calcification.   9. The mitral valve is normal in structure. Mild mitral valve  regurgitation. No evidence of mitral stenosis.  10. The tricuspid valve is normal in structure. Tricuspid valve  regurgitation is trivial.  11. The aortic valve is tricuspid. Aortic valve regurgitation is not  visualized. Mild aortic valve sclerosis without stenosis.  12. The pulmonic valve was not well visualized. Pulmonic valve  regurgitation is not visualized.  13. The inferior vena cava is normal in size with greater than 50%  respiratory variability, suggesting right atrial pressure of 3 mmHg.  14. Vigorous LV systolic function; mild LVH; grade 1 diastolic dysfunction  with elevated filling pressure; mild MR.   Recent Labs: No results found for requested labs within last 8760 hours.   Recent Lipid Panel No results found for: CHOL, TRIG, HDL, CHOLHDL, VLDL, LDLCALC, LDLDIRECT  Physical Exam:   VS:  BP 120/64 (BP Location: Left Arm, Patient Position: Sitting, Cuff Size: Normal)   Pulse 97   Resp 20   Ht 5\' 2"  (1.575 m)   Wt 196 lb 6.4 oz (89.1 kg)   SpO2 99%   BMI 35.92 kg/m    Wt Readings from Last 3 Encounters:  07/18/21 196 lb 6.4 oz (89.1 kg)  07/22/20 186 lb (84.4 kg)  03/24/20 210 lb (95.3 kg)    General: Well nourished, well developed,  in no acute distress Head: Atraumatic, normal size  Eyes: PEERLA, EOMI  Neck: Supple, no JVD Endocrine: No thryomegaly Cardiac: Normal S1, S2; RRR; no murmurs, rubs, or gallops Lungs: Clear to auscultation bilaterally, no wheezing, rhonchi or rales  Abd: Soft, nontender, no hepatomegaly  Ext: Trace edema Musculoskeletal: No deformities, BUE and BLE strength normal and equal Skin: Warm and dry, no rashes   Neuro: Alert and oriented to person, place, time, and situation, CNII-XII grossly intact, no focal deficits  Psych: Normal mood and affect   ASSESSMENT:   Cassandra Creamer  Bridges is a 70 y.o. female who presents for the following: 1. Chronic diastolic heart failure (HCC)   2. SOB (shortness of breath) on exertion   3. PAD (peripheral artery disease) (HCC)   4. Essential hypertension   5. Mixed hyperlipidemia     PLAN:   1. Chronic diastolic heart failure (HCC) 2. SOB (shortness of breath) on exertion -Euvolemic on exam.  She will continue with Lasix 40 mg daily.  She takes extra tablet as needed.  She is also on Jardiance 10 mg daily.  I have encouraged regular exercise as HFpEF is likely an exercise deficiency syndrome.  She will work on this.  She is working on salt reduction strategies.  Weights are stable.  She also wears compression stockings.  To me she is completely stable from a heart failure standpoint and doing well.  3. PAD (peripheral artery disease) (HCC) -50 to 74% stenosis in the CFA's bilaterally.  No significant symptoms of claudication.  We will continue with aspirin.  Increase Crestor to 40 mg daily.  Goal LDL cholesterol less than 70.  Not quite there.  4. Essential hypertension -Well-controlled in office today.  She seems to be controlling this with diet and exercise.  5. Mixed hyperlipidemia -Increase Crestor to 40 mg daily.  Goal LDL less than 70 given PAD and diabetes.  Disposition: Return in about 1 year (around 07/18/2022).  Medication Adjustments/Labs and  Tests Ordered: Current medicines are reviewed at length with the patient today.  Concerns regarding medicines are outlined above.  No orders of the defined types were placed in this encounter.  Meds ordered this encounter  Medications   rosuvastatin (CRESTOR) 40 MG tablet    Sig: Take 1 tablet (40 mg total) by mouth daily.    Dispense:  90 tablet    Refill:  1    Requesting 1 year supply     Patient Instructions  Medication Instructions:  Increase Crestor to 40 mg daily   *If you need a refill on your cardiac medications before your next appointment, please call your pharmacy*   Follow-Up: At [facility], you and your health needs are our priority.  As part of our continuing mission to provide you with exceptional heart care, we have created designated Provider Care Teams.  These Care Teams include your primary Cardiologist (physician) and Advanced Practice Providers (APPs -  Physician Assistants and Nurse Practitioners) who all work together to provide you with the care you need, when you need it.  We recommend signing up for the patient portal called "MyChart".  Sign up information is provided on this After Visit Summary.  MyChart is used to connect with patients for Virtual Visits (Telemedicine).  Patients are able to view lab/test results, encounter notes, upcoming appointments, etc.  Non-urgent messages can be sent to your provider as well.   To learn more about what you can do with MyChart, go to [email or web address].    Your next appointment:   12 month(s)  The format for your next appointment:   In Person  Provider:   Lennie Odor, MD     Time Spent with Patient: I have spent a total of 35 minutes with patient reviewing hospital notes, telemetry, EKGs, labs and examining the patient as well as establishing an assessment and plan that was discussed with the patient.  > 50% of time was spent in direct patient care.  Signed, Lenna Gilford. Flora Lipps, MD, [facility] [place]   [facility]  [address] [place], [place] [address] [phone]  07/18/2021 3:23 PM

## 2021-07-18 ENCOUNTER — Ambulatory Visit: Payer: Medicare Other | Admitting: Cardiovascular Disease

## 2021-07-18 ENCOUNTER — Encounter: Payer: Self-pay | Admitting: Cardiovascular Disease

## 2021-07-18 VITALS — BP 120/64 | HR 97 | Resp 20 | Ht 62.0 in | Wt 196.4 lb

## 2021-07-18 DIAGNOSIS — E782 Mixed hyperlipidemia: Secondary | ICD-10-CM

## 2021-07-18 DIAGNOSIS — R0602 Shortness of breath: Secondary | ICD-10-CM

## 2021-07-18 DIAGNOSIS — I5032 Chronic diastolic (congestive) heart failure: Secondary | ICD-10-CM | POA: Diagnosis not present

## 2021-07-18 DIAGNOSIS — I10 Essential (primary) hypertension: Secondary | ICD-10-CM | POA: Diagnosis not present

## 2021-07-18 DIAGNOSIS — I739 Peripheral vascular disease, unspecified: Secondary | ICD-10-CM | POA: Diagnosis not present

## 2021-07-18 MED ORDER — ROSUVASTATIN CALCIUM 40 MG PO TABS: 40.0000 mg | ORAL_TABLET | Freq: Every day | ORAL | 1 refills | Status: DC

## 2021-07-18 NOTE — Patient Instructions (Signed)
Medication Instructions:  Increase Crestor to 40 mg daily   *If you need a refill on your cardiac medications before your next appointment, please call your pharmacy*   Follow-Up: At [facility], you and your health needs are our priority.  As part of our continuing mission to provide you with exceptional heart care, we have created designated Provider Care Teams.  These Care Teams include your primary Cardiologist (physician) and Advanced Practice Providers (APPs -  Physician Assistants and Nurse Practitioners) who all work together to provide you with the care you need, when you need it.  We recommend signing up for the patient portal called "MyChart".  Sign up information is provided on this After Visit Summary.  MyChart is used to connect with patients for Virtual Visits (Telemedicine).  Patients are able to view lab/test results, encounter notes, upcoming appointments, etc.  Non-urgent messages can be sent to your provider as well.   To learn more about what you can do with MyChart, go to [email or web address].    Your next appointment:   12 month(s)  The format for your next appointment:   In Person  Provider:   Lennie Odor, MD

## 2021-09-09 DIAGNOSIS — H43813 Vitreous degeneration, bilateral: Secondary | ICD-10-CM | POA: Diagnosis not present

## 2021-10-19 DIAGNOSIS — I5032 Chronic diastolic (congestive) heart failure: Secondary | ICD-10-CM | POA: Diagnosis not present

## 2021-10-19 DIAGNOSIS — G8929 Other chronic pain: Secondary | ICD-10-CM | POA: Diagnosis not present

## 2021-10-19 DIAGNOSIS — I10 Essential (primary) hypertension: Secondary | ICD-10-CM | POA: Diagnosis not present

## 2021-10-19 DIAGNOSIS — I872 Venous insufficiency (chronic) (peripheral): Secondary | ICD-10-CM | POA: Diagnosis not present

## 2021-10-19 DIAGNOSIS — E1140 Type 2 diabetes mellitus with diabetic neuropathy, unspecified: Secondary | ICD-10-CM | POA: Diagnosis not present

## 2021-10-19 DIAGNOSIS — Z23 Encounter for immunization: Secondary | ICD-10-CM | POA: Diagnosis not present

## 2021-10-25 DIAGNOSIS — H43813 Vitreous degeneration, bilateral: Secondary | ICD-10-CM | POA: Diagnosis not present

## 2021-10-25 DIAGNOSIS — H43393 Other vitreous opacities, bilateral: Secondary | ICD-10-CM | POA: Diagnosis not present

## 2021-10-25 DIAGNOSIS — E119 Type 2 diabetes mellitus without complications: Secondary | ICD-10-CM | POA: Diagnosis not present

## 2021-10-25 DIAGNOSIS — H35363 Drusen (degenerative) of macula, bilateral: Secondary | ICD-10-CM | POA: Diagnosis not present

## 2022-05-01 DIAGNOSIS — I10 Essential (primary) hypertension: Secondary | ICD-10-CM | POA: Diagnosis not present

## 2022-05-01 DIAGNOSIS — Z0000 Encounter for general adult medical examination without abnormal findings: Secondary | ICD-10-CM | POA: Diagnosis not present

## 2022-05-01 DIAGNOSIS — I872 Venous insufficiency (chronic) (peripheral): Secondary | ICD-10-CM | POA: Diagnosis not present

## 2022-05-01 DIAGNOSIS — M169 Osteoarthritis of hip, unspecified: Secondary | ICD-10-CM | POA: Diagnosis not present

## 2022-05-01 DIAGNOSIS — I5032 Chronic diastolic (congestive) heart failure: Secondary | ICD-10-CM | POA: Diagnosis not present

## 2022-05-01 DIAGNOSIS — J45909 Unspecified asthma, uncomplicated: Secondary | ICD-10-CM | POA: Diagnosis not present

## 2022-05-01 DIAGNOSIS — M87052 Idiopathic aseptic necrosis of left femur: Secondary | ICD-10-CM | POA: Diagnosis not present

## 2022-05-01 DIAGNOSIS — E785 Hyperlipidemia, unspecified: Secondary | ICD-10-CM | POA: Diagnosis not present

## 2022-05-01 DIAGNOSIS — E1140 Type 2 diabetes mellitus with diabetic neuropathy, unspecified: Secondary | ICD-10-CM | POA: Diagnosis not present

## 2022-05-30 ENCOUNTER — Other Ambulatory Visit: Payer: Self-pay | Admitting: Internal Medicine

## 2022-05-30 DIAGNOSIS — Z1231 Encounter for screening mammogram for malignant neoplasm of breast: Secondary | ICD-10-CM

## 2022-06-12 ENCOUNTER — Ambulatory Visit
Admission: RE | Admit: 2022-06-12 | Discharge: 2022-06-12 | Disposition: A | Discharge status: Home / Self Care | Payer: Medicare Other | Source: Ambulatory Visit | Attending: Internal Medicine | Admitting: Internal Medicine

## 2022-06-12 DIAGNOSIS — Z1231 Encounter for screening mammogram for malignant neoplasm of breast: Secondary | ICD-10-CM | POA: Diagnosis not present

## 2022-08-07 DIAGNOSIS — Z961 Presence of intraocular lens: Secondary | ICD-10-CM | POA: Diagnosis not present

## 2022-08-07 DIAGNOSIS — H524 Presbyopia: Secondary | ICD-10-CM | POA: Diagnosis not present

## 2022-10-21 DIAGNOSIS — U071 COVID-19: Secondary | ICD-10-CM | POA: Diagnosis not present

## 2022-10-21 DIAGNOSIS — I10 Essential (primary) hypertension: Secondary | ICD-10-CM | POA: Diagnosis not present

## 2022-11-06 DIAGNOSIS — J019 Acute sinusitis, unspecified: Secondary | ICD-10-CM | POA: Diagnosis not present

## 2022-11-06 DIAGNOSIS — R030 Elevated blood-pressure reading, without diagnosis of hypertension: Secondary | ICD-10-CM | POA: Diagnosis not present

## 2023-02-01 ENCOUNTER — Other Ambulatory Visit: Payer: Self-pay

## 2023-02-01 ENCOUNTER — Emergency Department (HOSPITAL_COMMUNITY)
Admission: EM | Admit: 2023-02-01 | Discharge: 2023-02-02 | Disposition: A | Discharge status: Home / Self Care | Payer: Medicare Other | Attending: Emergency Medicine | Admitting: Emergency Medicine

## 2023-02-01 ENCOUNTER — Encounter (HOSPITAL_COMMUNITY): Payer: Self-pay

## 2023-02-01 DIAGNOSIS — E119 Type 2 diabetes mellitus without complications: Secondary | ICD-10-CM | POA: Insufficient documentation

## 2023-02-01 DIAGNOSIS — Z4801 Encounter for change or removal of surgical wound dressing: Secondary | ICD-10-CM | POA: Diagnosis not present

## 2023-02-01 DIAGNOSIS — W228XXA Striking against or struck by other objects, initial encounter: Secondary | ICD-10-CM | POA: Diagnosis not present

## 2023-02-01 DIAGNOSIS — Z23 Encounter for immunization: Secondary | ICD-10-CM | POA: Insufficient documentation

## 2023-02-01 DIAGNOSIS — Z5189 Encounter for other specified aftercare: Secondary | ICD-10-CM

## 2023-02-01 DIAGNOSIS — Z7984 Long term (current) use of oral hypoglycemic drugs: Secondary | ICD-10-CM | POA: Insufficient documentation

## 2023-02-01 DIAGNOSIS — S81802A Unspecified open wound, left lower leg, initial encounter: Secondary | ICD-10-CM | POA: Insufficient documentation

## 2023-02-01 LAB — COMPREHENSIVE METABOLIC PANEL
ALT: 17 U/L (ref 0–44)
AST: 16 U/L (ref 15–41)
Albumin: 3.3 g/dL — ABNORMAL LOW (ref 3.5–5.0)
Alkaline Phosphatase: 82 U/L (ref 38–126)
Anion gap: 9 (ref 5–15)
BUN: 7 mg/dL — ABNORMAL LOW (ref 8–23)
CO2: 26 mmol/L (ref 22–32)
Calcium: 8.9 mg/dL (ref 8.9–10.3)
Chloride: 103 mmol/L (ref 98–111)
Creatinine, Ser: 0.64 mg/dL (ref 0.44–1.00)
GFR, Estimated: >60 mL/min (ref >60)
Glucose, Bld: 123 mg/dL — ABNORMAL HIGH (ref 70–99)
Potassium: 3.2 mmol/L — ABNORMAL LOW (ref 3.5–5.1)
Sodium: 138 mmol/L (ref 135–145)
Total Bilirubin: 0.5 mg/dL (ref 0.3–1.2)
Total Protein: 6.9 g/dL (ref 6.5–8.1)

## 2023-02-01 LAB — CBC WITH DIFFERENTIAL/PLATELET
Abs Immature Granulocytes: 0.03 10*3/uL (ref 0.00–0.07)
Basophils Absolute: 0 10*3/uL (ref 0.0–0.1)
Basophils Relative: 0 %
Eosinophils Absolute: 0.3 10*3/uL (ref 0.0–0.5)
Eosinophils Relative: 3 %
HCT: 35.5 % — ABNORMAL LOW (ref 36.0–46.0)
Hemoglobin: 11.3 g/dL — ABNORMAL LOW (ref 12.0–15.0)
Immature Granulocytes: 0 %
Lymphocytes Relative: 33 %
Lymphs Abs: 3.2 10*3/uL (ref 0.7–4.0)
MCH: 26.7 pg (ref 26.0–34.0)
MCHC: 31.8 g/dL (ref 30.0–36.0)
MCV: 83.7 fL (ref 80.0–100.0)
Monocytes Absolute: 0.7 10*3/uL (ref 0.1–1.0)
Monocytes Relative: 8 %
Neutro Abs: 5.4 10*3/uL (ref 1.7–7.7)
Neutrophils Relative %: 56 %
Platelets: 367 10*3/uL (ref 150–400)
RBC: 4.24 MIL/uL (ref 3.87–5.11)
RDW: 13.4 % (ref 11.5–15.5)
WBC: 9.7 10*3/uL (ref 4.0–10.5)
nRBC: 0 % (ref 0.0–0.2)

## 2023-02-01 LAB — URINALYSIS, ROUTINE W REFLEX MICROSCOPIC
Bacteria, UA: NONE SEEN
Bilirubin Urine: NEGATIVE
Glucose, UA: NEGATIVE mg/dL
Ketones, ur: NEGATIVE mg/dL
Nitrite: NEGATIVE
Protein, ur: 30 mg/dL — AB
Specific Gravity, Urine: 1.024 (ref 1.005–1.030)
WBC, UA: >50 WBC/hpf (ref 0–5)
pH: 5 (ref 5.0–8.0)

## 2023-02-01 LAB — LACTIC ACID, PLASMA: Lactic Acid, Venous: 1.1 mmol/L (ref 0.5–1.9)

## 2023-02-01 NOTE — ED Triage Notes (Signed)
Pt hit leg on something two days ago, thought it was just a scratch. She put a bandage on it and checked it today. Now there is purulent drainage, redness and pain 6/10.

## 2023-02-02 DIAGNOSIS — Z5189 Encounter for other specified aftercare: Secondary | ICD-10-CM | POA: Diagnosis not present

## 2023-02-02 MED ORDER — SULFAMETHOXAZOLE-TRIMETHOPRIM 800-160 MG PO TABS: 1.0000 | ORAL_TABLET | Freq: Two times a day (BID) | ORAL | 0 refills | Status: AC

## 2023-02-02 MED ORDER — TETANUS-DIPHTH-ACELL PERTUSSIS 5-2.5-18.5 LF-MCG/0.5 IM SUSY
0.5000 mL | PREFILLED_SYRINGE | Freq: Once | INTRAMUSCULAR | Status: AC
  Administered 2023-02-02: 0.5 mL via INTRAMUSCULAR
  Filled 2023-02-02: qty 0.5

## 2023-02-02 MED ORDER — BACITRACIN ZINC 500 UNIT/GM EX OINT
TOPICAL_OINTMENT | Freq: Once | CUTANEOUS | Status: AC
  Administered 2023-02-02: 1 via TOPICAL
  Filled 2023-02-02: qty 1.8

## 2023-02-02 MED ORDER — MUPIROCIN CALCIUM 2 % EX CREA: 1.0000 | TOPICAL_CREAM | Freq: Two times a day (BID) | CUTANEOUS | 0 refills | Status: AC

## 2023-02-02 MED ORDER — SULFAMETHOXAZOLE-TRIMETHOPRIM 800-160 MG PO TABS
1.0000 | ORAL_TABLET | Freq: Once | ORAL | Status: AC
  Administered 2023-02-02: 1 via ORAL
  Filled 2023-02-02: qty 1

## 2023-02-02 MED ORDER — FLUCONAZOLE 150 MG PO TABS: 150.0000 mg | ORAL_TABLET | Freq: Every day | ORAL | 0 refills | Status: AC

## 2023-02-02 NOTE — Discharge Instructions (Signed)
Apply Bacitracin ointment to wound twice a day for 10 days. Take Bactrim 1 tablet twice a day for 10 days to treat UTI and wound infection. Please take full course of the medication. Take Diflucan one time after completion of antibiotic to prevent yeast infection.  Follow up with primary care for re-evaluation of wound after completion of antibiotics.  Return to ED if: You have a red streak of skin near the area around your wound. Pus or a bad smell coming from the wound. Your wound has been closed with staples, sutures, skin glue, or adhesive strips and it begins to open up and separate. Your wound is bleeding, and the bleeding does not stop with gentle pressure.
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
## 2023-02-12 DIAGNOSIS — M7989 Other specified soft tissue disorders: Secondary | ICD-10-CM | POA: Diagnosis not present

## 2023-02-12 DIAGNOSIS — L089 Local infection of the skin and subcutaneous tissue, unspecified: Secondary | ICD-10-CM | POA: Diagnosis not present

## 2023-02-12 DIAGNOSIS — S81802A Unspecified open wound, left lower leg, initial encounter: Secondary | ICD-10-CM | POA: Diagnosis not present

## 2023-02-14 DIAGNOSIS — E1140 Type 2 diabetes mellitus with diabetic neuropathy, unspecified: Secondary | ICD-10-CM | POA: Diagnosis not present

## 2023-02-14 DIAGNOSIS — I5032 Chronic diastolic (congestive) heart failure: Secondary | ICD-10-CM | POA: Diagnosis not present

## 2023-02-14 DIAGNOSIS — E1151 Type 2 diabetes mellitus with diabetic peripheral angiopathy without gangrene: Secondary | ICD-10-CM | POA: Diagnosis not present

## 2023-02-20 DIAGNOSIS — S81802D Unspecified open wound, left lower leg, subsequent encounter: Secondary | ICD-10-CM | POA: Diagnosis not present

## 2023-02-21 ENCOUNTER — Ambulatory Visit: Payer: Medicare Other | Admitting: Internal Medicine

## 2023-02-28 ENCOUNTER — Encounter: Payer: Medicare Other | Attending: Internal Medicine | Admitting: Internal Medicine

## 2023-02-28 DIAGNOSIS — E11622 Type 2 diabetes mellitus with other skin ulcer: Secondary | ICD-10-CM | POA: Insufficient documentation

## 2023-02-28 DIAGNOSIS — I890 Lymphedema, not elsewhere classified: Secondary | ICD-10-CM | POA: Insufficient documentation

## 2023-02-28 DIAGNOSIS — L97822 Non-pressure chronic ulcer of other part of left lower leg with fat layer exposed: Secondary | ICD-10-CM | POA: Insufficient documentation

## 2023-03-01 NOTE — Progress Notes (Signed)
VALITA, RIGHTER Bridges ([id number]) [phone] [email or web address] Page 1 of 5 Visit Report for 02/28/2023 Abuse Risk Screen Details Patient Name: Date of Service: Cassandra Pattricia Boss Bridges. 02/28/2023 1:30 PM Medical Record Number: [id number] Patient Account Number: [id number] Date of Birth/Sex: Treating RN: 03/28/1951 (72 y.o. Freddy Finner Primary Care Grae Leathers: Georgann Housekeeper Other Clinician: Referring Adonijah Baena: Treating Nivan Melendrez/Extender: [name] in Treatment: 0 Abuse Risk Screen Items Answer ABUSE RISK SCREEN: Has anyone close to you tried to hurt or harm you recentlyo No Do you feel uncomfortable with anyone in your familyo No Has anyone forced you do things that you didnt want to doo No Electronic Signature(s) Signed: 03/01/2023 4:10:11 PM By: Yevonne Pax RN Entered By: Yevonne Pax on 02/28/2023 14:04:22 -------------------------------------------------------------------------------- Activities of Daily Living Details Patient Name: Date of Service: Cassandra Pattricia Boss Bridges. 02/28/2023 1:30 PM Medical Record Number: [id number] Patient Account Number: [id number] Date of Birth/Sex: Treating RN: 10-Apr-1951 (72 y.o. Freddy Finner Primary Care Ginnette Gates: Georgann Housekeeper Other Clinician: Referring Zeric Baranowski: Treating Dequavion Follette/Extender: Orvan Seen Weeks in Treatment: 0 Activities of Daily Living Items Answer Activities of Daily Living (Please select one for each item) Drive Automobile Completely Able T Medications ake Completely Able Use T elephone Completely Able Care for Appearance Completely Able Use T oilet Completely Able Bath / Shower Completely Able Dress Self Completely Able Feed Self Completely Able Walk Completely Able Get In / Out Bed Completely Able Housework Completely Cassandra Bridges, Cassandra Bridges ([id number]) [phone] [email or web address] Page 2 of 5 Prepare Meals Completely Able Handle Money  Completely Able Shop for Self Completely Able Electronic Signature(s) Signed: 03/01/2023 4:10:11 PM By: Yevonne Pax RN Entered By: Yevonne Pax on 02/28/2023 14:04:43 -------------------------------------------------------------------------------- Education Screening Details Patient Name: Date of Service: Cassandra Bridges. 02/28/2023 1:30 PM Medical Record Number: [id number] Patient Account Number: [id number] Date of Birth/Sex: Treating RN: 1951/05/15 (72 y.o. Freddy Finner Primary Care Bettylou Frew: Georgann Housekeeper Other Clinician: Referring Soul Deveney: Treating Kohen Reither/Extender: [name] in Treatment: 0 Primary Learner Assessed: Patient Learning Preferences/Education Level/Primary Language Learning Preference: Explanation Highest Education Level: College or Above Preferred Language: English Cognitive Barrier Language Barrier: No Translator Needed: No Memory Deficit: No Emotional Barrier: No Cultural/Religious Beliefs Affecting Medical Care: No Physical Barrier Impaired Vision: Yes Glasses Impaired Hearing: No Decreased Hand dexterity: No Knowledge/Comprehension Knowledge Level: Medium Comprehension Level: High Ability to understand written instructions: High Ability to understand verbal instructions: High Motivation Anxiety Level: Anxious Cooperation: Cooperative Education Importance: Acknowledges Need Interest in Health Problems: Asks Questions Perception: Coherent Willingness to Engage in Self-Management High Activities: Readiness to Engage in Self-Management High Activities: Electronic Signature(s) Signed: 03/01/2023 4:10:11 PM By: Yevonne Pax RN Entered By: Yevonne Pax on 02/28/2023 14:05:16 Cassandra Bridges ([id number]) [id number] Page 3 of 5 -------------------------------------------------------------------------------- Fall Risk Assessment Details Patient Name: Date of Service: Cassandra Pattricia Boss Bridges. 02/28/2023 1:30 PM Medical Record Number: [id number] Patient Account Number: [id number] Date of Birth/Sex: Treating RN: Dec 13, 1950 (72 y.o. Freddy Finner Primary Care Laquan Ludden: Georgann Housekeeper Other Clinician: Referring Javen Ridings: Treating Ricki Vanhandel/Extender: [name] in Treatment: 0 Fall Risk Assessment Items Have you had 2 or more falls in the last 12 monthso 0 No Have you had any fall that resulted in injury in the last 12 monthso 0 No FALLS RISK SCREEN History of falling - immediate or within 3 months 0 No Secondary diagnosis (Do you have 2 or more medical diagnoseso) 0  No Ambulatory aid None/bed rest/wheelchair/nurse 0 Yes Crutches/cane/walker 0 No Furniture 0 No Intravenous therapy Access/Saline/Heparin Lock 0 No Gait/Transferring Normal/ bed rest/ wheelchair 0 Yes Weak (short steps with or without shuffle, stooped but able to lift head while walking, may seek 0 No support from furniture) Impaired (short steps with shuffle, may have difficulty arising from chair, head down, impaired 0 No balance) Mental Status Oriented to own ability 0 Yes Electronic Signature(s) Signed: 03/01/2023 4:10:11 PM By: Yevonne Pax RN Entered By: Yevonne Pax on 02/28/2023 14:05:28 -------------------------------------------------------------------------------- Foot Assessment Details Patient Name: Date of Service: Cassandra Bridges. 02/28/2023 1:30 PM Medical Record Number: [id number] Patient Account Number: [id number] Date of Birth/Sex: Treating RN: 12-26-1950 (72 y.o. Freddy Finner Primary Care Lelah Rennaker: Georgann Housekeeper Other Clinician: Referring Yaseen Gilberg: Treating Kelli Egolf/Extender: Orvan Seen Weeks in Treatment: 0 Foot Assessment Items Site Locations [place], [place] Bridges ([id number]) [id number] Page 4 of 5 + = Sensation present, - = Sensation absent, C = Callus, U = Ulcer R = Redness, W =  Warmth, M = Maceration, PU = Pre-ulcerative lesion F = Fissure, S = Swelling, D = Dryness Assessment Right: Left: Other Deformity: No No Prior Foot Ulcer: No No Prior Amputation: No No Charcot Joint: No No Ambulatory Status: Ambulatory Without Help Gait: Steady Electronic Signature(s) Signed: 03/01/2023 4:10:11 PM By: Yevonne Pax RN Entered By: Yevonne Pax on 02/28/2023 14:12:53 -------------------------------------------------------------------------------- Nutrition Risk Screening Details Patient Name: Date of Service: Cassandra Pattricia Boss Bridges. 02/28/2023 1:30 PM Medical Record Number: [id number] Patient Account Number: [id number] Date of Birth/Sex: Treating RN: February 14, 1951 (72 y.o. Freddy Finner Primary Care Adair Lauderback: Georgann Housekeeper Other Clinician: Referring Hannan Hutmacher: Treating Mccayla Shimada/Extender: Orvan Seen Weeks in Treatment: 0 Height (in): 62 Weight (lbs): 179 Body Mass Index (BMI): 32.7 Nutrition Risk Screening Items Score Screening NUTRITION RISK SCREEN: I have an illness or condition that made me change the kind and/or amount of food I eat 0 No I eat fewer than two meals per day 0 No I eat few fruits and vegetables, or milk products 0 No I have three or more drinks of beer, liquor or wine almost every day 0 No I have tooth or mouth problems that make it hard for me to eat 0 No I don't always have enough money to buy the food I need 0 No Cassandra Bridges, Cassandra Bridges ([id number]) [id number] Page 5 of 5 I eat alone most of the time 0 No I take three or more different prescribed or over-the-counter drugs Bridges day 1 Yes Without wanting to, I have lost or gained 10 pounds in the last six months 0 No I am not always physically able to shop, cook and/or feed myself 0 No Nutrition Protocols Good Risk Protocol 0 No interventions needed Moderate Risk Protocol High Risk Proctocol Risk Level: Good Risk Score: 1 Electronic  Signature(s) Signed: 03/01/2023 4:10:11 PM By: Yevonne Pax RN Entered By: Yevonne Pax on 02/28/2023 14:05:42

## 2023-03-01 NOTE — Progress Notes (Signed)
ZOEIE, RITTER A ([id number]) [id number] Page 1 of 9 Visit Report for 02/28/2023 Allergy List Details Patient Name: Date of Service: MO Pattricia Boss A. 02/28/2023 1:30 PM Medical Record Number: [id number] Patient Account Number: [id number] Date of Birth/Sex: Treating RN: Jun 13, 1951 (72 y.o. Freddy Finner Primary Care Leshea Jaggers: Georgann Housekeeper Other Clinician: Referring Amyr Sluder: Treating Stevan Eberwein/Extender: Orvan Seen Weeks in Treatment: 0 Allergies Active Allergies penicillin Ritalin metformin Effexor Allergy Notes Electronic Signature(s) Signed: 03/01/2023 4:10:11 PM By: Yevonne Pax RN Entered By: Yevonne Pax on 02/28/2023 14:02:55 -------------------------------------------------------------------------------- Arrival Information Details Patient Name: Date of Service: MO Venetia Constable H A. 02/28/2023 1:30 PM Medical Record Number: [id number] Patient Account Number: [id number] Date of Birth/Sex: Treating RN: 30-Aug-1951 (72 y.o. Freddy Finner Primary Care Karan Ramnauth: Georgann Housekeeper Other Clinician: Referring Bernestine Holsapple: Treating Nakai Yard/Extender: [name] in Treatment: 0 Visit Information Patient Arrived: Ambulatory Arrival Time: 14:00 Accompanied By: self Transfer Assistance: None Patient Identification Verified: Yes Secondary Verification Process Completed: Yes Patient Requires Transmission-Based Precautions: No Patient Has Alerts: No ZINNIA, TINDALL A ([id number]) [id number] Page 2 of 9 Electronic Signature(s) Signed: 03/01/2023 4:10:11 PM By: Yevonne Pax RN Entered By: Yevonne Pax on 02/28/2023 14:00:55 -------------------------------------------------------------------------------- Clinic Level of Care Assessment Details Patient Name: Date of Service: [place] Pattricia Boss A. 02/28/2023 1:30 PM Medical Record Number: [id number] Patient Account Number:  [id number] Date of Birth/Sex: Treating RN: 1951-03-31 (72 y.o. Freddy Finner Primary Care Saharah Sherrow: Georgann Housekeeper Other Clinician: Referring Anayah Arvanitis: Treating Seymour Pavlak/Extender: [name] in Treatment: 0 Clinic Level of Care Assessment Items TOOL 1 Quantity Score X- 1 0 Use when EandM and Procedure is performed on INITIAL visit ASSESSMENTS - Nursing Assessment / Reassessment X- 1 20 General Physical Exam (combine w/ comprehensive assessment (listed just below) when performed on new pt. evals) X- 1 25 Comprehensive Assessment (HX, ROS, Risk Assessments, Wounds Hx, etc.) ASSESSMENTS - Wound and Skin Assessment / Reassessment []  - 0 Dermatologic / Skin Assessment (not related to wound area) ASSESSMENTS - Ostomy and/or Continence Assessment and Care []  - 0 Incontinence Assessment and Management []  - 0 Ostomy Care Assessment and Management (repouching, etc.) PROCESS - Coordination of Care X - Simple Patient / Family Education for ongoing care 1 15 []  - 0 Complex (extensive) Patient / Family Education for ongoing care []  - 0 Staff obtains Chiropractor, Records, T Results / Process Orders est []  - 0 Staff telephones HHA, Nursing Homes / Clarify orders / etc []  - 0 Routine Transfer to another Facility (non-emergent condition) []  - 0 Routine Hospital Admission (non-emergent condition) X- 1 15 New Admissions / Manufacturing engineer / Ordering NPWT Apligraf, etc. , []  - 0 Emergency Hospital Admission (emergent condition) PROCESS - Special Needs []  - 0 Pediatric / Minor Patient Management []  - 0 Isolation Patient Management []  - 0 Hearing / Language / Visual special needs []  - 0 Assessment of Community assistance (transportation, D/C planning, etc.) []  - 0 Additional assistance / Altered mentation []  - 0 Support Surface(s) Assessment (bed, cushion, seat, etc.) INTERVENTIONS - Miscellaneous []  - 0 External ear exam MAIMOUNA, RONDEAU A  ([id number]) [id number] Page 3 of 9 []  - 0 Patient Transfer (multiple staff / Nurse, adult / Similar devices) []  - 0 Simple Staple / Suture removal (25 or less) []  - 0 Complex Staple / Suture removal (26 or more) []  - 0 Hypo/Hyperglycemic Management (do not check if billed separately) X- 1 15 Ankle / Brachial Index (ABI) - do  not check if billed separately Has the patient been seen at the hospital within the last three years: Yes Total Score: 90 Level Of Care: New/Established - Level 3 Electronic Signature(s) Signed: 03/01/2023 4:10:11 PM By: Yevonne Pax RN Entered By: Yevonne Pax on 02/28/2023 15:01:00 -------------------------------------------------------------------------------- Encounter Discharge Information Details Patient Name: Date of Service: MO Venetia Constable H A. 02/28/2023 1:30 PM Medical Record Number: [id number] Patient Account Number: [id number] Date of Birth/Sex: Treating RN: 12-18-1950 (72 y.o. Freddy Finner Primary Care Matyas Baisley: Georgann Housekeeper Other Clinician: Referring Verenice Westrich: Treating Jennalyn Cawley/Extender: [name] in Treatment: 0 Encounter Discharge Information Items Post Procedure Vitals Discharge Condition: Stable Temperature (F): 97.4 Ambulatory Status: Ambulatory Pulse (bpm): 91 Discharge Destination: Home Respiratory Rate (breaths/min): 18 Transportation: Private Auto Blood Pressure (mmHg): 108/56 Accompanied By: self Schedule Follow-up Appointment: Yes Clinical Summary of Care: Electronic Signature(s) Signed: 02/28/2023 3:02:13 PM By: Yevonne Pax RN Entered By: Yevonne Pax on 02/28/2023 15:02:12 -------------------------------------------------------------------------------- Lower Extremity Assessment Details Patient Name: Date of Service: [place] Pattricia Boss A. 02/28/2023 1:30 PM Medical Record Number: [id number] Patient Account Number: [id number] Date of Birth/Sex: Treating RN: Sep 02, 1951  (72 y.o. Freddy Finner Primary Care Devrin Monforte: Georgann Housekeeper Other Clinician: Referring Luv Mish: Treating Kypton Eltringham/Extender: Orvan Seen Weeks in TreatmentYANETTE, TRIPOLI A ([id number]) [id number] Page 4 of 9 Edema Assessment Assessed: [Left: No] [Right: No] Edema: [Left: Ye] [Right: s] Calf Left: Right: Point of Measurement: 34 cm From Medial Instep 43 cm Ankle Left: Right: Point of Measurement: 10 cm From Medial Instep 27 cm Knee To Floor Left: Right: From Medial Instep 44 cm Vascular Assessment Pulses: Dorsalis Pedis Palpable: [Left:Yes] Doppler Audible: [Left:Yes] Blood Pressure: Brachial: [Left:108] Ankle: [Left:Dorsalis Pedis: 110 1.02] Electronic Signature(s) Signed: 03/01/2023 4:10:11 PM By: Yevonne Pax RN Entered By: Yevonne Pax on 02/28/2023 14:17:44 -------------------------------------------------------------------------------- Multi Wound Chart Details Patient Name: Date of Service: MO Venetia Constable H A. 02/28/2023 1:30 PM Medical Record Number: [id number] Patient Account Number: [id number] Date of Birth/Sex: Treating RN: 04/25/1951 (73 y.o. Freddy Finner Primary Care Christia Domke: Georgann Housekeeper Other Clinician: Referring Daylyn Christine: Treating Monterrius Cardosa/Extender: Orvan Seen Weeks in Treatment: 0 Vital Signs Height(in): 62 Pulse(bpm): 91 Weight(lbs): 179 Blood Pressure(mmHg): 108/56 Body Mass Index(BMI): 32.7 Temperature(F): 97.4 Respiratory Rate(breaths/min): 18 [1:Photos:] [N/A:N/A] Left, Lateral Lower Leg N/A N/A Wound Location: Trauma N/A N/A Wounding Event: Diabetic Wound/Ulcer of the Lower N/A N/A Primary Etiology: Extremity Hypertension, Type II Diabetes, N/A N/A Comorbid History: Rheumatoid Arthritis 02/03/2023 N/A N/A Date Acquired: 0 N/A N/A Weeks of Treatment: Open N/A N/A Wound Status: No N/A N/A Wound Recurrence: 6x6x0.6 N/A N/A Measurements L x W x D  (cm) 28.274 N/A N/A A (cm) : rea 16.965 N/A N/A Volume (cm) : Grade 1 N/A N/A Classification: Medium N/A N/A Exudate A mount: Serosanguineous N/A N/A Exudate Type: red, brown N/A N/A Exudate Color: Small (1-33%) N/A N/A Granulation A mount: Red N/A N/A Granulation Quality: Large (67-100%) N/A N/A Necrotic A mount: Fat Layer (Subcutaneous Tissue): Yes N/A N/A Exposed Structures: Fascia: No Tendon: No Muscle: No Joint: No Bone: No None N/A N/A Epithelialization: Debridement - Excisional N/A N/A Debridement: Pre-procedure Verification/Time Out 14:40 N/A N/A Taken: Subcutaneous, Slough N/A N/A Tissue Debrided: Skin/Subcutaneous Tissue N/A N/A Level: 28.26 N/A N/A Debridement A (sq cm): rea Curette N/A N/A Instrument: Moderate N/A N/A Bleeding: Pressure N/A N/A Hemostasis A chieved: 0 N/A N/A Procedural Pain: 0 N/A N/A Post Procedural Pain: Procedure was tolerated well N/A N/A Debridement  Treatment Response: 6x6x0.6 N/A N/A Post Debridement Measurements L x W x D (cm) 16.965 N/A N/A Post Debridement Volume: (cm) Debridement N/A N/A Procedures Performed: Treatment Notes Electronic Signature(s) Signed: 02/28/2023 4:25:37 PM By: Geralyn Corwin DO Entered By: Geralyn Corwin on 02/28/2023 14:48:21 -------------------------------------------------------------------------------- Multi-Disciplinary Care Plan Details Patient Name: Date of Service: MO Venetia Constable H A. 02/28/2023 1:30 PM Medical Record Number: [id number] Patient Account Number: [id number] Date of Birth/Sex: Treating RN: March 03, 1951 (72 y.o. Freddy Finner Primary Care Shahzad Thomann: Georgann Housekeeper Other Clinician: Referring Shondell Poulson: Treating Ashkan Chamberland/Extender: Orvan Seen Weeks in Treatment: 0 Active Inactive Necrotic Tissue GAILA, ENGEBRETSEN A ([id number]) [id number] Page 6 of 9 Nursing Diagnoses: Knowledge deficit related to management of  necrotic/devitalized tissue Goals: Necrotic/devitalized tissue will be minimized in the wound bed Date Initiated: 02/28/2023 Target Resolution Date: 03/30/2023 Goal Status: Active Interventions: Assess patient pain level pre-, during and post procedure and prior to discharge Notes: Wound/Skin Impairment Nursing Diagnoses: Knowledge deficit related to ulceration/compromised skin integrity Goals: Patient/caregiver will verbalize understanding of skin care regimen Date Initiated: 02/28/2023 Target Resolution Date: 03/30/2023 Goal Status: Active Ulcer/skin breakdown will have a volume reduction of 30% by week 4 Date Initiated: 02/28/2023 Target Resolution Date: 03/30/2023 Goal Status: Active Ulcer/skin breakdown will have a volume reduction of 50% by week 8 Date Initiated: 02/28/2023 Target Resolution Date: 04/30/2023 Goal Status: Active Ulcer/skin breakdown will have a volume reduction of 80% by week 12 Date Initiated: 02/28/2023 Target Resolution Date: 05/31/2023 Goal Status: Active Ulcer/skin breakdown will heal within 14 weeks Date Initiated: 02/28/2023 Target Resolution Date: 06/30/2023 Goal Status: Active Interventions: Assess patient/caregiver ability to obtain necessary supplies Assess patient/caregiver ability to perform ulcer/skin care regimen upon admission and as needed Assess ulceration(s) every visit Notes: Electronic Signature(s) Signed: 03/01/2023 4:10:11 PM By: Yevonne Pax RN Entered By: Yevonne Pax on 02/28/2023 14:18:54 -------------------------------------------------------------------------------- Pain Assessment Details Patient Name: Date of Service: MO Venetia Constable H A. 02/28/2023 1:30 PM Medical Record Number: [id number] Patient Account Number: [id number] Date of Birth/Sex: Treating RN: 09-16-1950 (72 y.o. Freddy Finner Primary Care Camber Ninh: Georgann Housekeeper Other Clinician: Referring Karyna Bessler: Treating Dynasty Holquin/Extender: Orvan Seen Weeks in Treatment: 0 Active Problems Location of Pain Severity and Description of Pain Patient Has Paino No Site Locations [place], [place] A ([id number]) [id number] Page 7 of 9 Pain Management and Medication Current Pain Management: Electronic Signature(s) Signed: 03/01/2023 4:10:11 PM By: Yevonne Pax RN Entered By: Yevonne Pax on 02/28/2023 14:01:18 -------------------------------------------------------------------------------- Patient/Caregiver Education Details Patient Name: Date of Service: MO Pattricia Boss A. 6/26/2024andnbsp1:30 PM Medical Record Number: [id number] Patient Account Number: [id number] Date of Birth/Gender: Treating RN: 02/13/1951 (72 y.o. Freddy Finner Primary Care Physician: Georgann Housekeeper Other Clinician: Referring Physician: Treating Physician/Extender: [name] in Treatment: 0 Education Assessment Education Provided To: Patient Education Topics Provided Welcome [facility]New [facility] Packet: o Handouts: Welcome T [facility] o Methods: Explain/Verbal Responses: State content correctly Electronic Signature(s) Signed: 03/01/2023 4:10:11 PM By: Yevonne Pax RN Entered By: Yevonne Pax on 02/28/2023 14:19:12 Fernand Parkins A ([id number]) [id number] Page 8 of 9 -------------------------------------------------------------------------------- Wound Assessment Details Patient Name: Date of Service: MO Pattricia Boss A. 02/28/2023 1:30 PM Medical Record Number: [id number] Patient Account Number: [id number] Date of Birth/Sex: Treating RN: 25-Dec-1950 (72 y.o. Freddy Finner Primary Care Villa Burgin: Georgann Housekeeper Other Clinician: Referring Cerena Baine: Treating Makyle Eslick/Extender: Orvan Seen Weeks in Treatment: 0 Wound Status Wound Number: 1 Primary Etiology: Diabetic  Wound/Ulcer of the Lower Extremity Wound Location:  Left, Lateral Lower Leg Wound Status: Open Wounding Event: Trauma Comorbid History: Hypertension, Type II Diabetes, Rheumatoid Arthritis Date Acquired: 02/03/2023 Weeks Of Treatment: 0 Clustered Wound: No Photos Wound Measurements Length: (cm) 6 Width: (cm) 6 Depth: (cm) 0.6 Area: (cm) 28.274 Volume: (cm) 16.965 % Reduction in Area: % Reduction in Volume: Epithelialization: None Tunneling: No Undermining: No Wound Description Classification: Grade 1 Exudate Amount: Medium Exudate Type: Serosanguineous Exudate Color: red, brown Foul Odor After Cleansing: No Slough/Fibrino Yes Wound Bed Granulation Amount: Small (1-33%) Exposed Structure Granulation Quality: Red Fascia Exposed: No Necrotic Amount: Large (67-100%) Fat Layer (Subcutaneous Tissue) Exposed: Yes Necrotic Quality: Adherent Slough Tendon Exposed: No Muscle Exposed: No Joint Exposed: No Bone Exposed: No Treatment Notes Wound #1 (Lower Leg) Wound Laterality: Left, Lateral Cleanser Vashe 5.8 (oz) Discharge Instruction: Use vashe 5.8 (oz) as directed Peri-Wound Care DEEPTI, GUNAWAN A ([id number]) [id number] Page 9 of 9 Topical Gentamicin Discharge Instruction: Apply as directed by Matthew Pais. Mupirocin Ointment Discharge Instruction: Apply as directed by Jarvis Sawa. Primary Dressing Hydrofera Blue Ready Transfer Foam, 2.5x2.5 (in/in) Discharge Instruction: Apply Hydrofera Blue Ready to wound bed as directed Secondary Dressing ABD Pad 5x9 (in/in) Discharge Instruction: Cover with ABD pad Secured With Coban Cohesive Bandage 4x5 (yds) Stretched Discharge Instruction: Apply Coban as directed. Kerlix Roll Sterile or Non-Sterile 6-ply 4.5x4 (yd/yd) Discharge Instruction: Apply Kerlix as directed Compression Wrap Compression Stockings Add-Ons Electronic Signature(s) Signed: 03/01/2023 4:10:11 PM By: Yevonne Pax RN Entered By: Yevonne Pax on 02/28/2023  14:16:16 -------------------------------------------------------------------------------- Vitals Details Patient Name: Date of Service: MO [name], DELILA H A. 02/28/2023 1:30 PM Medical Record Number: [id number] Patient Account Number: [id number] Date of Birth/Sex: Treating RN: [birth year] (72 y.o. Freddy Finner Primary Care Dodge Ator: Georgann Housekeeper Other Clinician: Referring Maddix Kliewer: Treating Kellyanne Ellwanger/Extender: Orvan Seen Weeks in Treatment: 0 Vital Signs Time Taken: 14:00 Temperature (F): 97.4 Height (in): 62 Pulse (bpm): 91 Source: Stated Respiratory Rate (breaths/min): 18 Weight (lbs): 179 Blood Pressure (mmHg): 108/56 Source: Stated Reference Range: 80 - 120 mg / dl Body Mass Index (BMI): 32.7 Electronic Signature(s) Signed: 03/01/2023 4:10:11 PM By: Yevonne Pax RN Entered By: Yevonne Pax on 02/28/2023 14:01:47
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
## 2023-03-07 ENCOUNTER — Encounter: Payer: Medicare Other | Attending: Internal Medicine | Admitting: Internal Medicine

## 2023-03-07 DIAGNOSIS — E11622 Type 2 diabetes mellitus with other skin ulcer: Secondary | ICD-10-CM | POA: Diagnosis not present

## 2023-03-07 DIAGNOSIS — I890 Lymphedema, not elsewhere classified: Secondary | ICD-10-CM | POA: Diagnosis not present

## 2023-03-07 DIAGNOSIS — L97822 Non-pressure chronic ulcer of other part of left lower leg with fat layer exposed: Secondary | ICD-10-CM | POA: Insufficient documentation

## 2023-03-07 DIAGNOSIS — Z7984 Long term (current) use of oral hypoglycemic drugs: Secondary | ICD-10-CM | POA: Diagnosis not present

## 2023-03-14 ENCOUNTER — Encounter (HOSPITAL_BASED_OUTPATIENT_CLINIC_OR_DEPARTMENT_OTHER): Payer: Medicare Other | Admitting: Internal Medicine

## 2023-03-14 DIAGNOSIS — Z7984 Long term (current) use of oral hypoglycemic drugs: Secondary | ICD-10-CM | POA: Diagnosis not present

## 2023-03-14 DIAGNOSIS — E11622 Type 2 diabetes mellitus with other skin ulcer: Secondary | ICD-10-CM

## 2023-03-14 DIAGNOSIS — I890 Lymphedema, not elsewhere classified: Secondary | ICD-10-CM | POA: Diagnosis not present

## 2023-03-14 DIAGNOSIS — L97822 Non-pressure chronic ulcer of other part of left lower leg with fat layer exposed: Secondary | ICD-10-CM

## 2023-03-15 NOTE — Progress Notes (Signed)
Cassandra Bridges, Cassandra Bridges ([id number]) [id number] Page 1 of 9 Visit Report for 03/14/2023 Arrival Information Details Patient Name: Date of Service: Cassandra Pattricia Boss Bridges. 03/14/2023 3:00 PM Medical Record Number: [id number] Patient Account Number: [id number] Date of Birth/Sex: Treating RN: 26-Jun-1951 (72 y.o. Freddy Finner Primary Care Rudy Luhmann: Georgann Housekeeper Other Clinician: Referring Delbert Vu: Treating Hilarie Sinha/Extender: [name] in Treatment: 2 Visit Information History Since Last Visit Added or deleted any medications: No Patient Arrived: Ambulatory Any new allergies or adverse reactions: No Arrival Time: 15:22 Had Bridges fall or experienced change in No Accompanied By: self activities of daily living that may affect Transfer Assistance: None risk of falls: Patient Identification Verified: Yes Signs or symptoms of abuse/neglect since last visito No Secondary Verification Process Completed: Yes Hospitalized since last visit: No Patient Requires Transmission-Based Precautions: No Implantable device outside of the clinic excluding No Patient Has Alerts: No cellular tissue based products placed in the center since last visit: Has Dressing in Place as Prescribed: Yes Has Compression in Place as Prescribed: Yes Pain Present Now: No Electronic Signature(s) Signed: 03/15/2023 1:44:50 PM By: Yevonne Pax RN Entered By: Yevonne Pax on 03/14/2023 15:22:54 -------------------------------------------------------------------------------- Clinic Level of Care Assessment Details Patient Name: Date of Service: Cassandra Pattricia Boss Bridges. 03/14/2023 3:00 PM Medical Record Number: [id number] Patient Account Number: [id number] Date of Birth/Sex: Treating RN: 12/31/1950 (72 y.o. Freddy Finner Primary Care Theresa Dohrman: Georgann Housekeeper Other Clinician: Referring Zanyia Silbaugh: Treating Denver Bentson/Extender: [name] in Treatment:  2 Clinic Level of Care Assessment Items TOOL 1 Quantity Score []  - 0 Use when EandM and Procedure is performed on INITIAL visit ASSESSMENTS - Nursing Assessment / Reassessment []  - 0 General Physical Exam (combine w/ comprehensive assessment (listed just below) when performed on new pt. evals) []  - 0 Comprehensive Assessment (HX, ROS, Risk Assessments, Wounds Hx, etc.) Cassandra Bridges, Cassandra Bridges ([id number]) [id number] Page 2 of 9 ASSESSMENTS - Wound and Skin Assessment / Reassessment []  - 0 Dermatologic / Skin Assessment (not related to wound area) ASSESSMENTS - Ostomy and/or Continence Assessment and Care []  - 0 Incontinence Assessment and Management []  - 0 Ostomy Care Assessment and Management (repouching, etc.) PROCESS - Coordination of Care []  - 0 Simple Patient / Family Education for ongoing care []  - 0 Complex (extensive) Patient / Family Education for ongoing care []  - 0 Staff obtains Chiropractor, Records, T Results / Process Orders est []  - 0 Staff telephones HHA, Nursing Homes / Clarify orders / etc []  - 0 Routine Transfer to another Facility (non-emergent condition) []  - 0 Routine Hospital Admission (non-emergent condition) []  - 0 New Admissions / Manufacturing engineer / Ordering NPWT Apligraf, etc. , []  - 0 Emergency Hospital Admission (emergent condition) PROCESS - Special Needs []  - 0 Pediatric / Minor Patient Management []  - 0 Isolation Patient Management []  - 0 Hearing / Language / Visual special needs []  - 0 Assessment of Community assistance (transportation, D/C planning, etc.) []  - 0 Additional assistance / Altered mentation []  - 0 Support Surface(s) Assessment (bed, cushion, seat, etc.) INTERVENTIONS - Miscellaneous []  - 0 External ear exam []  - 0 Patient Transfer (multiple staff / Nurse, adult / Similar devices) []  - 0 Simple Staple / Suture removal (25 or less) []  - 0 Complex Staple / Suture removal (26 or more) []  -  0 Hypo/Hyperglycemic Management (do not check if billed separately) []  - 0 Ankle / Brachial Index (ABI) - do not check if billed separately Has  the patient been seen at the hospital within the last three years: Yes Total Score: 0 Level Of Care: ____ Electronic Signature(s) Signed: 03/15/2023 1:44:50 PM By: Yevonne Pax RN Entered By: Yevonne Pax on 03/14/2023 16:13:02 -------------------------------------------------------------------------------- Compression Therapy Details Patient Name: Date of Service: Cassandra Bridges, Cassandra H Bridges. 03/14/2023 3:00 PM Medical Record Number: [id number] Patient Account Number: [id number] Date of Birth/Sex: Treating RN: 1951-05-26 (72 y.o. Freddy Finner Primary Care Zenia Guest: Georgann Housekeeper Other Clinician: Referring Jakyrie Totherow: Treating Yaffa Seckman/Extender: Orvan Seen [place], [place] Bridges ([id number]) [id number] Page 3 of 9 Weeks in Treatment: 2 Compression Therapy Performed for Wound Assessment: Wound #1 Left,Lateral Lower Leg Performed By: Clinician Yevonne Pax, RN Compression Type: Double Layer Post Procedure Diagnosis Same as Pre-procedure Electronic Signature(s) Signed: 03/15/2023 1:44:50 PM By: Yevonne Pax RN Entered By: Yevonne Pax on 03/14/2023 16:12:51 -------------------------------------------------------------------------------- Encounter Discharge Information Details Patient Name: Date of Service: Cassandra Venetia Constable H Bridges. 03/14/2023 3:00 PM Medical Record Number: [id number] Patient Account Number: [id number] Date of Birth/Sex: Treating RN: 1950/10/26 (72 y.o. Freddy Finner Primary Care Queenie Aufiero: Georgann Housekeeper Other Clinician: Referring Walter Min: Treating Kmari Halter/Extender: [name] in Treatment: 2 Encounter Discharge Information Items Discharge Condition: Stable Ambulatory Status: Ambulatory Discharge Destination: Home Transportation: Private Auto Accompanied By:  self Schedule Follow-up Appointment: Yes Clinical Summary of Care: Electronic Signature(s) Signed: 03/15/2023 1:44:50 PM By: Yevonne Pax RN Entered By: Yevonne Pax on 03/14/2023 16:13:41 -------------------------------------------------------------------------------- Lower Extremity Assessment Details Patient Name: Date of Service: Cassandra Pattricia Boss Bridges. 03/14/2023 3:00 PM Medical Record Number: [id number] Patient Account Number: [id number] Date of Birth/Sex: Treating RN: 1950/11/08 (72 y.o. Freddy Finner Primary Care Sharronda Schweers: Georgann Housekeeper Other Clinician: Referring Montrell Cessna: Treating Moise Friday/Extender: Orvan Seen Weeks in Treatment: 2 Edema Assessment M[Left: Cassandra Bridges, Cassandra Bridges ([id number])] Franne Forts: [id number] Page 4 of 9] Assessed: [Left: No] [Right: No] Edema: [Left: Ye] [Right: s] Calf Left: Right: Point of Measurement: 34 cm From Medial Instep 39 cm Ankle Left: Right: Point of Measurement: 10 cm From Medial Instep 23.5 cm Vascular Assessment Pulses: Dorsalis Pedis Palpable: [Left:Yes] Electronic Signature(s) Signed: 03/15/2023 1:44:50 PM By: Yevonne Pax RN Entered By: Yevonne Pax on 03/14/2023 15:27:25 -------------------------------------------------------------------------------- Multi Wound Chart Details Patient Name: Date of Service: Cassandra Venetia Constable H Bridges. 03/14/2023 3:00 PM Medical Record Number: [id number] Patient Account Number: [id number] Date of Birth/Sex: Treating RN: 1950/09/23 (72 y.o. Freddy Finner Primary Care Aaron Boeh: Georgann Housekeeper Other Clinician: Referring Azel Gumina: Treating Kensey Luepke/Extender: Orvan Seen Weeks in Treatment: 2 Vital Signs Height(in): 62 Pulse(bpm): 106 Weight(lbs): 179 Blood Pressure(mmHg): 148/77 Body Mass Index(BMI): 32.7 Temperature(F): 97.3 Respiratory Rate(breaths/min): 18 [1:Photos:] [N/Bridges:N/Bridges] Left, Lateral Lower Leg N/Bridges N/Bridges Wound Location: Trauma  N/Bridges N/Bridges Wounding Event: Diabetic Wound/Ulcer of the Lower N/Bridges N/Bridges Primary Etiology: Extremity Hypertension, Type II Diabetes, N/Bridges N/Bridges Comorbid History: Rheumatoid Arthritis 02/03/2023 N/Bridges N/Bridges Date Acquired: 2 N/Bridges N/Bridges Weeks of Treatment: Open N/Bridges N/Bridges Wound Status: No N/Bridges N/Bridges Wound Recurrence: 0.9x6x0.8 N/Bridges N/Bridges Measurements L x W x D (cm) 4.241 N/Bridges N/Bridges Bridges (cm) : Cassandra Bridges, Cassandra Bridges ([id number]) [id number] Page 5 of 9 3.393 N/Bridges N/Bridges Volume (cm) : 85.00% N/Bridges N/Bridges % Reduction in Bridges rea: 80.00% N/Bridges N/Bridges % Reduction in Volume: Grade 1 N/Bridges N/Bridges Classification: Medium N/Bridges N/Bridges Exudate Bridges mount: Serosanguineous N/Bridges N/Bridges Exudate Type: red, brown N/Bridges N/Bridges Exudate Color: Medium (34-66%) N/Bridges N/Bridges Granulation Bridges mount: Red N/Bridges N/Bridges Granulation Quality: Medium (34-66%) N/Bridges N/Bridges Necrotic Bridges mount:  Fat Layer (Subcutaneous Tissue): Yes N/Bridges N/Bridges Exposed Structures: Fascia: No Tendon: No Muscle: No Joint: No Bone: No None N/Bridges N/Bridges Epithelialization: Treatment Notes Electronic Signature(s) Signed: 03/15/2023 1:44:50 PM By: Yevonne Pax RN Entered By: Yevonne Pax on 03/14/2023 15:27:30 -------------------------------------------------------------------------------- Multi-Disciplinary Care Plan Details Patient Name: Date of Service: Cassandra Venetia Constable H Bridges. 03/14/2023 3:00 PM Medical Record Number: [id number] Patient Account Number: [id number] Date of Birth/Sex: Treating RN: Mar 07, 1951 (72 y.o. Freddy Finner Primary Care Vendela Troung: Georgann Housekeeper Other Clinician: Referring Danyel Griess: Treating Valaree Fresquez/Extender: [name] in Treatment: 2 Active Inactive Necrotic Tissue Nursing Diagnoses: Knowledge deficit related to management of necrotic/devitalized tissue Goals: Necrotic/devitalized tissue will be minimized in the wound bed Date Initiated: 02/28/2023 Target Resolution Date: 03/30/2023 Goal Status: Active Interventions: Assess patient  pain level pre-, during and post procedure and prior to discharge Notes: Wound/Skin Impairment Nursing Diagnoses: Knowledge deficit related to ulceration/compromised skin integrity Goals: Patient/caregiver will verbalize understanding of skin care regimen Date Initiated: 02/28/2023 Target Resolution Date: 03/30/2023 Goal Status: Active Ulcer/skin breakdown will have Bridges volume reduction of 30% by week 4 Date Initiated: 02/28/2023 Target Resolution Date: 03/30/2023 Cassandra Bridges, Cassandra Bridges ([id number]) [phone].pdf Page 6 of 9 Goal Status: Active Ulcer/skin breakdown will have Bridges volume reduction of 50% by week 8 Date Initiated: 02/28/2023 Target Resolution Date: 04/30/2023 Goal Status: Active Ulcer/skin breakdown will have Bridges volume reduction of 80% by week 12 Date Initiated: 02/28/2023 Target Resolution Date: 05/31/2023 Goal Status: Active Ulcer/skin breakdown will heal within 14 weeks Date Initiated: 02/28/2023 Target Resolution Date: 06/30/2023 Goal Status: Active Interventions: Assess patient/caregiver ability to obtain necessary supplies Assess patient/caregiver ability to perform ulcer/skin care regimen upon admission and as needed Assess ulceration(s) every visit Notes: Electronic Signature(s) Signed: 03/15/2023 1:44:50 PM By: Yevonne Pax RN Entered By: Yevonne Pax on 03/14/2023 15:27:43 -------------------------------------------------------------------------------- Pain Assessment Details Patient Name: Date of Service: Cassandra Venetia Constable H Bridges. 03/14/2023 3:00 PM Medical Record Number: [id number] Patient Account Number: [id number] Date of Birth/Sex: Treating RN: 1951/01/24 (72 y.o. Freddy Finner Primary Care Teondra Newburg: Georgann Housekeeper Other Clinician: Referring Jovonna Nickell: Treating Hendricks Schwandt/Extender: Orvan Seen Weeks in Treatment: 2 Active Problems Location of Pain Severity and Description of Pain Patient Has Paino No Site Locations Pain  Management and Medication Current Pain Management: Electronic Signature(s) Signed: 03/15/2023 1:44:50 PM By: Yevonne Pax RN Lequita Bridges, Cassandra Bridges ([id number]) [id number] Page 7 of 9 Entered By: Yevonne Pax on 03/14/2023 15:23:21 -------------------------------------------------------------------------------- Patient/Caregiver Education Details Patient Name: Date of Service: Cassandra Pattricia Boss Bridges. 7/10/2024andnbsp3:00 PM Medical Record Number: [id number] Patient Account Number: [id number] Date of Birth/Gender: Treating RN: 05/13/1951 (72 y.o. Freddy Finner Primary Care Physician: Georgann Housekeeper Other Clinician: Referring Physician: Treating Physician/Extender: [name] in Treatment: 2 Education Assessment Education Provided To: Patient Education Topics Provided Wound/Skin Impairment: Handouts: Caring for Your Ulcer Methods: Explain/Verbal Responses: State content correctly Electronic Signature(s) Signed: 03/15/2023 1:44:50 PM By: Yevonne Pax RN Entered By: Yevonne Pax on 03/14/2023 15:28:10 -------------------------------------------------------------------------------- Wound Assessment Details Patient Name: Date of Service: Cassandra Venetia Constable H Bridges. 03/14/2023 3:00 PM Medical Record Number: [id number] Patient Account Number: [id number] Date of Birth/Sex: Treating RN: 1951/07/12 (72 y.o. Freddy Finner Primary Care Shifa Brisbon: Georgann Housekeeper Other Clinician: Referring Evin Loiseau: Treating Jaclyn Andy/Extender: Orvan Seen Weeks in Treatment: 2 Wound Status Wound Number: 1 Primary Etiology: Diabetic Wound/Ulcer of the Lower Extremity Wound Location: Left, Lateral Lower Leg Wound Status: Open Wounding Event: Trauma Comorbid History: Hypertension, Type II  Diabetes, Rheumatoid Arthritis Date Acquired: 02/03/2023 Weeks Of Treatment: 2 Clustered Wound: No Photos Cassandra Bridges, Cassandra Bridges ([id number])  [id number] Page 8 of 9 Wound Measurements Length: (cm) 0.9 Width: (cm) 6 Depth: (cm) 0.8 Area: (cm) 4.241 Volume: (cm) 3.393 % Reduction in Area: 85% % Reduction in Volume: 80% Epithelialization: None Tunneling: No Undermining: No Wound Description Classification: Grade 1 Exudate Amount: Medium Exudate Type: Serosanguineous Exudate Color: red, brown Foul Odor After Cleansing: No Slough/Fibrino Yes Wound Bed Granulation Amount: Medium (34-66%) Exposed Structure Granulation Quality: Red Fascia Exposed: No Necrotic Amount: Medium (34-66%) Fat Layer (Subcutaneous Tissue) Exposed: Yes Necrotic Quality: Adherent Slough Tendon Exposed: No Muscle Exposed: No Joint Exposed: No Bone Exposed: No Treatment Notes Wound #1 (Lower Leg) Wound Laterality: Left, Lateral Cleanser Vashe 5.8 (oz) Discharge Instruction: Use vashe 5.8 (oz) as directed Peri-Wound Care Topical Gentamicin Discharge Instruction: Apply as directed by Blaire Hodsdon. Mupirocin Ointment Discharge Instruction: Apply as directed by Zackariah Vanderpol. Primary Dressing Hydrofera Blue Ready Transfer Foam, 2.5x2.5 (in/in) Discharge Instruction: Apply Hydrofera Blue Ready to wound bed as directed Secondary Dressing ABD Pad 5x9 (in/in) Discharge Instruction: Cover with ABD pad Secured With Compression Wrap Urgo K2 Lite, two layer compression system, large Compression Stockings Add-Ons Electronic Signature(s) Signed: 03/15/2023 1:44:50 PM By: Yevonne Pax RN Entered By: Yevonne Pax on 03/14/2023 15:26:41 Cassandra Bridges ([id number]) [id number] Page 9 of 9 -------------------------------------------------------------------------------- Vitals Details Patient Name: Date of Service: Cassandra Pattricia Boss Bridges. 03/14/2023 3:00 PM Medical Record Number: [id number] Patient Account Number: [id number] Date of Birth/Sex: Treating RN: 03/24/1951 (72 y.o. Freddy Finner Primary Care  Dawnette Mione: Georgann Housekeeper Other Clinician: Referring Eisley Barber: Treating Angelice Piech/Extender: Orvan Seen Weeks in Treatment: 2 Vital Signs Time Taken: 15:22 Temperature (F): 97.3 Height (in): 62 Pulse (bpm): 106 Weight (lbs): 179 Respiratory Rate (breaths/min): 18 Body Mass Index (BMI): 32.7 Blood Pressure (mmHg): 148/77 Reference Range: 80 - 120 mg / dl Electronic Signature(s) Signed: 03/15/2023 1:44:50 PM By: Yevonne Pax RN Entered By: Yevonne Pax on 03/14/2023 15:23:15
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
## 2023-03-21 ENCOUNTER — Encounter (HOSPITAL_BASED_OUTPATIENT_CLINIC_OR_DEPARTMENT_OTHER): Payer: Medicare Other | Admitting: Internal Medicine

## 2023-03-21 DIAGNOSIS — E11622 Type 2 diabetes mellitus with other skin ulcer: Secondary | ICD-10-CM

## 2023-03-21 DIAGNOSIS — I890 Lymphedema, not elsewhere classified: Secondary | ICD-10-CM | POA: Diagnosis not present

## 2023-03-21 DIAGNOSIS — L97822 Non-pressure chronic ulcer of other part of left lower leg with fat layer exposed: Secondary | ICD-10-CM

## 2023-03-21 DIAGNOSIS — Z7984 Long term (current) use of oral hypoglycemic drugs: Secondary | ICD-10-CM | POA: Diagnosis not present

## 2023-03-22 NOTE — Progress Notes (Signed)
JEYLIN, WOODMANSEE A ([id number]) [id number] Page 1 of 10 Visit Report for 03/21/2023 Arrival Information Details Patient Name: Date of Service: MO Cassandra Boss A. 03/21/2023 3:45 PM Medical Record Number: [id number] Patient Account Number: [id number] Date of Birth/Sex: Treating RN: 23-Aug-1951 (72 y.o. Freddy Finner Primary Care Jaleiyah Alas: Georgann Housekeeper Other Clinician: Referring Kateryn Marasigan: Treating Saban Heinlen/Extender: [name] in Treatment: 3 Visit Information History Since Last Visit Added or deleted any medications: No Patient Arrived: Ambulatory Any new allergies or adverse reactions: No Arrival Time: 16:02 Had a fall or experienced change in No Accompanied By: self activities of daily living that may affect Transfer Assistance: None risk of falls: Patient Identification Verified: Yes Signs or symptoms of abuse/neglect since last visito No Secondary Verification Process Completed: Yes Hospitalized since last visit: No Patient Requires Transmission-Based Precautions: No Implantable device outside of the clinic excluding No Patient Has Alerts: No cellular tissue based products placed in the center since last visit: Has Dressing in Place as Prescribed: Yes Has Compression in Place as Prescribed: Yes Pain Present Now: No Electronic Signature(s) Signed: 03/22/2023 3:42:23 PM By: Yevonne Pax RN Entered By: Yevonne Pax on 03/21/2023 16:03:49 -------------------------------------------------------------------------------- Clinic Level of Care Assessment Details Patient Name: Date of Service: MO Cassandra Boss A. 03/21/2023 3:45 PM Medical Record Number: [id number] Patient Account Number: [id number] Date of Birth/Sex: Treating RN: 02/12/1951 (72 y.o. Freddy Finner Primary Care Aubryn Spinola: Georgann Housekeeper Other Clinician: Referring Jayvian Escoe: Treating Money Mckeithan/Extender: [name] in Treatment:  3 Clinic Level of Care Assessment Items TOOL 4 Quantity Score []  - 0 Use when only an EandM is performed on FOLLOW-UP visit ASSESSMENTS - Nursing Assessment / Reassessment []  - 0 Reassessment of Co-morbidities (includes updates in patient status) []  - 0 Reassessment of Adherence to Treatment Plan RAYANNA, MATUSIK A ([id number]) [id number] Page 2 of 10 ASSESSMENTS - Wound and Skin A ssessment / Reassessment []  - 0 Simple Wound Assessment / Reassessment - one wound []  - 0 Complex Wound Assessment / Reassessment - multiple wounds []  - 0 Dermatologic / Skin Assessment (not related to wound area) ASSESSMENTS - Focused Assessment []  - 0 Circumferential Edema Measurements - multi extremities []  - 0 Nutritional Assessment / Counseling / Intervention []  - 0 Lower Extremity Assessment (monofilament, tuning fork, pulses) []  - 0 Peripheral Arterial Disease Assessment (using hand held doppler) ASSESSMENTS - Ostomy and/or Continence Assessment and Care []  - 0 Incontinence Assessment and Management []  - 0 Ostomy Care Assessment and Management (repouching, etc.) PROCESS - Coordination of Care []  - 0 Simple Patient / Family Education for ongoing care []  - 0 Complex (extensive) Patient / Family Education for ongoing care []  - 0 Staff obtains Chiropractor, Records, T Results / Process Orders est []  - 0 Staff telephones HHA, Nursing Homes / Clarify orders / etc []  - 0 Routine Transfer to another Facility (non-emergent condition) []  - 0 Routine Hospital Admission (non-emergent condition) []  - 0 New Admissions / Manufacturing engineer / Ordering NPWT Apligraf, etc. , []  - 0 Emergency Hospital Admission (emergent condition) []  - 0 Simple Discharge Coordination []  - 0 Complex (extensive) Discharge Coordination PROCESS - Special Needs []  - 0 Pediatric / Minor Patient Management []  - 0 Isolation Patient Management []  - 0 Hearing / Language / Visual special  needs []  - 0 Assessment of Community assistance (transportation, D/C planning, etc.) []  - 0 Additional assistance / Altered mentation []  - 0 Support Surface(s) Assessment (bed, cushion, seat, etc.) INTERVENTIONS - Wound  Cleansing / Measurement []  - 0 Simple Wound Cleansing - one wound []  - 0 Complex Wound Cleansing - multiple wounds []  - 0 Wound Imaging (photographs - any number of wounds) []  - 0 Wound Tracing (instead of photographs) []  - 0 Simple Wound Measurement - one wound []  - 0 Complex Wound Measurement - multiple wounds INTERVENTIONS - Wound Dressings []  - 0 Small Wound Dressing one or multiple wounds []  - 0 Medium Wound Dressing one or multiple wounds []  - 0 Large Wound Dressing one or multiple wounds []  - 0 Application of Medications - topical []  - 0 Application of Medications - injection INTERVENTIONS - Miscellaneous []  - 0 External ear exam VENNESA, BASTEDO A ([id number]) [id number] Page 3 of 10 []  - 0 Specimen Collection (cultures, biopsies, blood, body fluids, etc.) []  - 0 Specimen(s) / Culture(s) sent or taken to Lab for analysis []  - 0 Patient Transfer (multiple staff / Michiel Sites Lift / Similar devices) []  - 0 Simple Staple / Suture removal (25 or less) []  - 0 Complex Staple / Suture removal (26 or more) []  - 0 Hypo / Hyperglycemic Management (close monitor of Blood Glucose) []  - 0 Ankle / Brachial Index (ABI) - do not check if billed separately []  - 0 Vital Signs Has the patient been seen at the hospital within the last three years: Yes Total Score: 0 Level Of Care: ____ Electronic Signature(s) Signed: 03/22/2023 3:42:23 PM By: Yevonne Pax RN Entered By: Yevonne Pax on 03/21/2023 16:16:25 -------------------------------------------------------------------------------- Compression Therapy Details Patient Name: Date of Service: MO Venetia Constable H A. 03/21/2023 3:45 PM Medical Record Number: [id number] Patient Account  Number: [id number] Date of Birth/Sex: Treating RN: 1950/11/11 (72 y.o. Freddy Finner Primary Care Anzleigh Slaven: Georgann Housekeeper Other Clinician: Referring Tannya Gonet: Treating Dimitriy Carreras/Extender: Orvan Seen Weeks in Treatment: 3 Compression Therapy Performed for Wound Assessment: Wound #1 Left,Lateral Lower Leg Performed By: Clinician Yevonne Pax, RN Compression Type: Double Layer Post Procedure Diagnosis Same as Pre-procedure Electronic Signature(s) Signed: 03/22/2023 3:42:23 PM By: Yevonne Pax RN Entered By: Yevonne Pax on 03/21/2023 16:15:41 -------------------------------------------------------------------------------- Encounter Discharge Information Details Patient Name: Date of Service: MO Venetia Constable H A. 03/21/2023 3:45 PM Medical Record Number: [id number] Patient Account Number: [id number] Date of Birth/Sex: Treating RN: April 04, 1951 (72 y.o. Freddy Finner [place], [place] A ([id number]) [id number] Page 4 of 10 Primary Care Herny Scurlock: Georgann Housekeeper Other Clinician: Referring Jovita Persing: Treating Arvon Schreiner/Extender: [name] in Treatment: 3 Encounter Discharge Information Items Discharge Condition: Stable Ambulatory Status: Ambulatory Discharge Destination: Home Transportation: Private Auto Accompanied By: self Schedule Follow-up Appointment: Yes Clinical Summary of Care: Electronic Signature(s) Signed: 03/22/2023 3:42:23 PM By: Yevonne Pax RN Entered By: Yevonne Pax on 03/21/2023 16:17:20 -------------------------------------------------------------------------------- Lower Extremity Assessment Details Patient Name: Date of Service: MO Cassandra Boss A. 03/21/2023 3:45 PM Medical Record Number: [id number] Patient Account Number: [id number] Date of Birth/Sex: Treating RN: 12-10-1950 (72 y.o. Freddy Finner Primary Care Danzel Marszalek: Georgann Housekeeper Other Clinician: Referring Aundra Espin: Treating  Filicia Scogin/Extender: Orvan Seen Weeks in Treatment: 3 Edema Assessment Assessed: [Left: No] [Right: No] Edema: [Left: N] [Right: o] Calf Left: Right: Point of Measurement: 34 cm From Medial Instep 39 cm Ankle Left: Right: Point of Measurement: 10 cm From Medial Instep 23.5 cm Vascular Assessment Pulses: Dorsalis Pedis Palpable: [Left:Yes] Electronic Signature(s) Signed: 03/22/2023 3:42:23 PM By: Yevonne Pax RN Entered By: Yevonne Pax on 03/21/2023 16:11:18 Fernand Parkins A ([id number]) [id number] Page 5 of 10 -------------------------------------------------------------------------------- Multi Wound Chart Details  Patient Name: Date of Service: MO Cassandra Boss A. 03/21/2023 3:45 PM Medical Record Number: [id number] Patient Account Number: [id number] Date of Birth/Sex: Treating RN: 1951-03-29 (72 y.o. Freddy Finner Primary Care Chasidy Janak: Georgann Housekeeper Other Clinician: Referring Finneas Mathe: Treating Rashawd Laskaris/Extender: Orvan Seen Weeks in Treatment: 3 Vital Signs Height(in): 62 Pulse(bpm): 104 Weight(lbs): 179 Blood Pressure(mmHg): 154/66 Body Mass Index(BMI): 32.7 Temperature(F): 98.4 Respiratory Rate(breaths/min): 16 [1:Photos:] [N/A:N/A] Left, Lateral Lower Leg N/A N/A Wound Location: Trauma N/A N/A Wounding Event: Diabetic Wound/Ulcer of the Lower N/A N/A Primary Etiology: Extremity Hypertension, Type II Diabetes, N/A N/A Comorbid History: Rheumatoid Arthritis 02/03/2023 N/A N/A Date Acquired: 3 N/A N/A Weeks of Treatment: Open N/A N/A Wound Status: No N/A N/A Wound Recurrence: 0.5x3.5x0.3 N/A N/A Measurements L x W x D (cm) 1.374 N/A N/A A (cm) : rea 0.412 N/A N/A Volume (cm) : 95.10% N/A N/A % Reduction in A rea: 97.60% N/A N/A % Reduction in Volume: Grade 1 N/A N/A Classification: Medium N/A N/A Exudate A mount: Serosanguineous N/A N/A Exudate Type: red, brown N/A  N/A Exudate Color: Medium (34-66%) N/A N/A Granulation A mount: Red N/A N/A Granulation Quality: Medium (34-66%) N/A N/A Necrotic A mount: Fat Layer (Subcutaneous Tissue): Yes N/A N/A Exposed Structures: Fascia: No Tendon: No Muscle: No Joint: No Bone: No None N/A N/A Epithelialization: Treatment Notes Electronic Signature(s) Signed: 03/22/2023 3:42:23 PM By: Yevonne Pax RN Entered By: Yevonne Pax on 03/21/2023 16:11:28 Fernand Parkins A ([id number]) [id number] Page 6 of 10 -------------------------------------------------------------------------------- Multi-Disciplinary Care Plan Details Patient Name: Date of Service: MO Cassandra Boss A. 03/21/2023 3:45 PM Medical Record Number: [id number] Patient Account Number: [id number] Date of Birth/Sex: Treating RN: 09/09/1950 (72 y.o. Freddy Finner Primary Care Aymee Fomby: Georgann Housekeeper Other Clinician: Referring Mirtha Jain: Treating Yahsir Wickens/Extender: [name] in Treatment: 3 Active Inactive Necrotic Tissue Nursing Diagnoses: Knowledge deficit related to management of necrotic/devitalized tissue Goals: Necrotic/devitalized tissue will be minimized in the wound bed Date Initiated: 02/28/2023 Target Resolution Date: 03/30/2023 Goal Status: Active Interventions: Assess patient pain level pre-, during and post procedure and prior to discharge Notes: Wound/Skin Impairment Nursing Diagnoses: Knowledge deficit related to ulceration/compromised skin integrity Goals: Patient/caregiver will verbalize understanding of skin care regimen Date Initiated: 02/28/2023 Target Resolution Date: 03/30/2023 Goal Status: Active Ulcer/skin breakdown will have a volume reduction of 30% by week 4 Date Initiated: 02/28/2023 Target Resolution Date: 03/30/2023 Goal Status: Active Ulcer/skin breakdown will have a volume reduction of 50% by week 8 Date Initiated: 02/28/2023 Target Resolution Date:  04/30/2023 Goal Status: Active Ulcer/skin breakdown will have a volume reduction of 80% by week 12 Date Initiated: 02/28/2023 Target Resolution Date: 05/31/2023 Goal Status: Active Ulcer/skin breakdown will heal within 14 weeks Date Initiated: 02/28/2023 Target Resolution Date: 06/30/2023 Goal Status: Active Interventions: Assess patient/caregiver ability to obtain necessary supplies Assess patient/caregiver ability to perform ulcer/skin care regimen upon admission and as needed Assess ulceration(s) every visit Notes: Electronic Signature(s) Signed: 03/22/2023 3:42:23 PM By: Yevonne Pax RN Entered By: Yevonne Pax on 03/21/2023 16:11:54 Fernand Parkins A ([id number]) [id number] Page 7 of 10 -------------------------------------------------------------------------------- Pain Assessment Details Patient Name: Date of Service: MO Cassandra Boss A. 03/21/2023 3:45 PM Medical Record Number: [id number] Patient Account Number: [id number] Date of Birth/Sex: Treating RN: 01-03-1951 (72 y.o. Freddy Finner Primary Care Winnie Umali: Georgann Housekeeper Other Clinician: Referring Corneisha Alvi: Treating Lyle Niblett/Extender: Orvan Seen Weeks in Treatment: 3 Active Problems Location of Pain Severity and Description of Pain Patient Has Paino  No Site Locations Pain Management and Medication Current Pain Management: Electronic Signature(s) Signed: 03/22/2023 3:42:23 PM By: Yevonne Pax RN Entered By: Yevonne Pax on 03/21/2023 16:04:15 -------------------------------------------------------------------------------- Patient/Caregiver Education Details Patient Name: Date of Service: MO Cassandra Boss A. 7/17/2024andnbsp3:45 PM Medical Record Number: [id number] Patient Account Number: [id number] Date of Birth/Gender: Treating RN: Jan 27, 1951 (72 y.o. Freddy Finner Primary Care Physician: Georgann Housekeeper Other Clinician: Referring Physician: Treating  Physician/Extender: [name] in Treatment: 3 Josclyn, Rosales [facility] A ([id number]) [id number] Page 8 of 10 Education Assessment Education Provided To: Patient Education Topics Provided Wound/Skin Impairment: Handouts: Caring for Your Ulcer Methods: Explain/Verbal Responses: State content correctly Electronic Signature(s) Signed: 03/22/2023 3:42:23 PM By: Yevonne Pax RN Entered By: Yevonne Pax on 03/21/2023 16:12:11 -------------------------------------------------------------------------------- Wound Assessment Details Patient Name: Date of Service: MO Cassandra Boss A. 03/21/2023 3:45 PM Medical Record Number: [id number] Patient Account Number: [id number] Date of Birth/Sex: Treating RN: [birth year] (72 y.o. Freddy Finner Primary Care Shianna Bally: Georgann Housekeeper Other Clinician: Referring Celedonio Sortino: Treating Jamarian Jacinto/Extender: Orvan Seen Weeks in Treatment: 3 Wound Status Wound Number: 1 Primary Etiology: Diabetic Wound/Ulcer of the Lower Extremity Wound Location: Left, Lateral Lower Leg Wound Status: Open Wounding Event: Trauma Comorbid History: Hypertension, Type II Diabetes, Rheumatoid Arthritis Date Acquired: 02/03/2023 Weeks Of Treatment: 3 Clustered Wound: No Photos Wound Measurements Length: (cm) 0.5 Width: (cm) 3.5 Depth: (cm) 0.3 Area: (cm) 1.374 Volume: (cm) 0.412 % Reduction in Area: 95.1% % Reduction in Volume: 97.6% Epithelialization: None Tunneling: No Undermining: No Wound Description Classification: Grade 1 Exudate Amount: Medium Exudate Type: Serosanguineous Stjames, Julio A ([id number]) Exudate Color: red, brown Foul Odor After Cleansing: No Slough/Fibrino Yes [id number] Page 9 of 10 Wound Bed Granulation Amount: Medium (34-66%) Exposed Structure Granulation Quality: Red Fascia Exposed: No Necrotic Amount: Medium (34-66%) Fat Layer (Subcutaneous  Tissue) Exposed: Yes Necrotic Quality: Adherent Slough Tendon Exposed: No Muscle Exposed: No Joint Exposed: No Bone Exposed: No Treatment Notes Wound #1 (Lower Leg) Wound Laterality: Left, Lateral Cleanser Vashe 5.8 (oz) Discharge Instruction: Use vashe 5.8 (oz) as directed Peri-Wound Care Topical Gentamicin Discharge Instruction: Apply as directed by Lucine Bilski. Mupirocin Ointment Discharge Instruction: Apply as directed by Rache Klimaszewski. Primary Dressing Hydrofera Blue Ready Transfer Foam, 2.5x2.5 (in/in) Discharge Instruction: Apply Hydrofera Blue Ready to wound bed as directed Secondary Dressing ABD Pad 5x9 (in/in) Discharge Instruction: Cover with ABD pad Secured With Compression Wrap Urgo K2 Lite, two layer compression system, large Compression Stockings Add-Ons Electronic Signature(s) Signed: 03/22/2023 3:42:23 PM By: Yevonne Pax RN Entered By: Yevonne Pax on 03/21/2023 16:10:59 -------------------------------------------------------------------------------- Vitals Details Patient Name: Date of Service: MO Venetia Constable H A. 03/21/2023 3:45 PM Medical Record Number: [id number] Patient Account Number: [id number] Date of Birth/Sex: Treating RN: [birth year] (72 y.o. Freddy Finner Primary Care Tempie Gibeault: Georgann Housekeeper Other Clinician: Referring Clive Parcel: Treating Franshesca Chipman/Extender: Orvan Seen Weeks in Treatment: 3 Vital Signs Time Taken: 14:06 Temperature (F): 98.4 Height (in): 62 Pulse (bpm): 104 Weight (lbs): 179 Respiratory Rate (breaths/min): 16 Grigoryan, Alnisa A ([id number]) [id number] Page 10 of 10 Body Mass Index (BMI): 32.7 Blood Pressure (mmHg): 154/66 Reference Range: 80 - 120 mg / dl Electronic Signature(s) Signed: 03/22/2023 3:42:23 PM By: Yevonne Pax RN Entered By: Yevonne Pax on 03/21/2023 16:04:08
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
## 2023-03-28 ENCOUNTER — Encounter (HOSPITAL_BASED_OUTPATIENT_CLINIC_OR_DEPARTMENT_OTHER): Payer: Medicare Other | Admitting: Internal Medicine

## 2023-03-28 DIAGNOSIS — I890 Lymphedema, not elsewhere classified: Secondary | ICD-10-CM | POA: Diagnosis not present

## 2023-03-28 DIAGNOSIS — Z7984 Long term (current) use of oral hypoglycemic drugs: Secondary | ICD-10-CM | POA: Diagnosis not present

## 2023-03-28 DIAGNOSIS — E11622 Type 2 diabetes mellitus with other skin ulcer: Secondary | ICD-10-CM | POA: Diagnosis not present

## 2023-03-28 DIAGNOSIS — L97822 Non-pressure chronic ulcer of other part of left lower leg with fat layer exposed: Secondary | ICD-10-CM
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
## 2023-03-28 NOTE — Progress Notes (Signed)
MARQUESHA, ROBIDEAU A ([id number]) [id number] Page 1 of 6 Visit Report for 03/28/2023 Arrival Information Details Patient Name: Date of Service: MO Cassandra Boss A. 03/28/2023 1:00 PM Medical Record Number: [id number] Patient Account Number: [id number] Date of Birth/Sex: Treating RN: [birth year] (72 y.o. [name], [name] Primary Care Yashika Mask: Georgann Housekeeper Other Clinician: Referring Hadleigh Felber: Treating Fawne Hughley/Extender: [name] in Treatment: 4 Visit Information History Since Last Visit All ordered tests and consults were completed: No Patient Arrived: Ambulatory Added or deleted any medications: No Arrival Time: 13:24 Any new allergies or adverse reactions: No Accompanied By: self Pain Present Now: No Transfer Assistance: None Patient Identification Verified: Yes Secondary Verification Process Completed: Yes Patient Requires Transmission-Based Precautions: No Patient Has Alerts: No Electronic Signature(s) Signed: 03/28/2023 4:46:32 PM By: Bonnell Public Entered By: Bonnell Public on 03/28/2023 13:26:32 -------------------------------------------------------------------------------- Encounter Discharge Information Details Patient Name: Date of Service: MO Cassandra Constable H A. 03/28/2023 1:00 PM Medical Record Number: [id number] Patient Account Number: [id number] Date of Birth/Sex: Treating RN: 01-14-1951 (72 y.o. [name], [name] Primary Care Amal Saiki: Georgann Housekeeper Other Clinician: Referring Tanequa Kretz: Treating Quilla Freeze/Extender: [name] in Treatment: 4 Encounter Discharge Information Items Post Procedure Vitals Discharge Condition: Stable Temperature (F): 97.8 Ambulatory Status: Ambulatory Pulse (bpm): 109 Discharge Destination: Home Respiratory Rate (breaths/min): 18 Transportation: Private Auto Blood Pressure (mmHg): 112/71 Schedule Follow-up Appointment: Yes Clinical Summary of  Care: Electronic Signature(s) Signed: 03/28/2023 4:46:32 PM By: Bonnell Public Entered By: Bonnell Public on 03/28/2023 13:44:14 Fernand Parkins A ([id number]) [id number] Page 2 of 6 -------------------------------------------------------------------------------- Lower Extremity Assessment Details Patient Name: Date of Service: MO Cassandra Boss A. 03/28/2023 1:00 PM Medical Record Number: [id number] Patient Account Number: [id number] Date of Birth/Sex: Treating RN: [birth year] (72 y.o. [name], [name] Primary Care Tasheka Houseman: Georgann Housekeeper Other Clinician: Referring Augusto Deckman: Treating Jahaad Penado/Extender: Orvan Seen Weeks in Treatment: 4 Edema Assessment Assessed: [Left: No] [Right: No] Edema: [Left: Bridges] [Right: o] Vascular Assessment Pulses: Dorsalis Pedis Palpable: [Left:Yes] Electronic Signature(s) Signed: 03/28/2023 4:46:32 PM By: Bonnell Public Entered By: Bonnell Public on 03/28/2023 13:32:52 -------------------------------------------------------------------------------- Multi-Disciplinary Care Plan Details Patient Name: Date of Service: MO Cassandra Constable H A. 03/28/2023 1:00 PM Medical Record Number: [id number] Patient Account Number: [id number] Date of Birth/Sex: Treating RN: 24-Nov-1950 (72 y.o. [name], [name] Primary Care Donnamae Muilenburg: Georgann Housekeeper Other Clinician: Referring Nathaly Dawkins: Treating Sharol Croghan/Extender: Orvan Seen Weeks in Treatment: 4 Active Inactive Wound/Skin Impairment Nursing Diagnoses: Knowledge deficit related to ulceration/compromised skin integrity Goals: Patient/caregiver will verbalize understanding of skin care regimen Date Initiated: 02/28/2023 Date Inactivated: 03/28/2023 Target Resolution Date: 03/30/2023 Goal Status: Met Ulcer/skin breakdown will have a volume reduction of 30% by week 4 KERRILYNN, DERENZO A ([id number]) [id number] Page 3 of 6 Date Initiated:  02/28/2023 Date Inactivated: 03/28/2023 Target Resolution Date: 03/30/2023 Goal Status: Met Ulcer/skin breakdown will have a volume reduction of 50% by week 8 Date Initiated: 02/28/2023 Target Resolution Date: 04/30/2023 Goal Status: Active Ulcer/skin breakdown will have a volume reduction of 80% by week 12 Date Initiated: 02/28/2023 Target Resolution Date: 05/31/2023 Goal Status: Active Ulcer/skin breakdown will heal within 14 weeks Date Initiated: 02/28/2023 Target Resolution Date: 06/30/2023 Goal Status: Active Interventions: Assess patient/caregiver ability to obtain necessary supplies Assess patient/caregiver ability to perform ulcer/skin care regimen upon admission and as needed Assess ulceration(s) every visit Notes: Electronic Signature(s) Signed: 03/28/2023 4:46:32 PM By: Bonnell Public Entered By: Bonnell Public on 03/28/2023 13:41:36 -------------------------------------------------------------------------------- Pain Assessment Details Patient Name: Date of Service:  [name], Cassandra H A. 03/28/2023 1:00 PM Medical Record Number: [id number] Patient Account Number: [id number] Date of Birth/Sex: Treating RN: [birth year] (72 y.o. [name], [name] Primary Care Dacy Enrico: Georgann Housekeeper Other Clinician: Referring Leyli Kevorkian: Treating Alaiya Martindelcampo/Extender: Orvan Seen Weeks in Treatment: 4 Active Problems Location of Pain Severity and Description of Pain Patient Has Paino No Site Locations Pain Management and Medication Current Pain Management: Electronic Signature(s) MELIS, TROCHEZ A ([id number]) [id number] Page 4 of 6 Signed: 03/28/2023 4:46:32 PM By: Bonnell Public Entered By: Bonnell Public on 03/28/2023 13:31:32 -------------------------------------------------------------------------------- Patient/Caregiver Education Details Patient Name: Date of Service: MO Cassandra Ty Hilts A. 7/24/2024andnbsp1:00 PM Medical Record Number:  [id number] Patient Account Number: [id number] Date of Birth/Gender: Treating RN: November 20, 1950 (72 y.o. [name], [name] Primary Care Physician: Georgann Housekeeper Other Clinician: Referring Physician: Treating Physician/Extender: [name] in Treatment: 4 Education Assessment Education Provided To: Patient Education Topics Provided Wound/Skin Impairment: Handouts: Caring for Your Ulcer Methods: Explain/Verbal Responses: State content correctly Electronic Signature(s) Signed: 03/28/2023 4:46:32 PM By: Bonnell Public Entered By: Bonnell Public on 03/28/2023 13:41:51 -------------------------------------------------------------------------------- Wound Assessment Details Patient Name: Date of Service: MO Cassandra Constable H A. 03/28/2023 1:00 PM Medical Record Number: [id number] Patient Account Number: [id number] Date of Birth/Sex: Treating RN: December 31, 1950 (72 y.o. [name], [name] Primary Care Amalya Salmons: Georgann Housekeeper Other Clinician: Referring Abimael Zeiter: Treating Demaris Leavell/Extender: Orvan Seen Weeks in Treatment: 4 Wound Status Wound Number: 1 Primary Etiology: Diabetic Wound/Ulcer of the Lower Extremity Wound Location: Left, Lateral Lower Leg Wound Status: Open Wounding Event: Trauma Comorbid History: Hypertension, Type II Diabetes, Rheumatoid Arthritis Date Acquired: 02/03/2023 Weeks Of Treatment: 4 Clustered Wound: No ZIYAH, CORDOBA A ([id number]) [id number] Page 5 of 6 Photos Wound Measurements Length: (cm) 0.3 Width: (cm) 0.3 Depth: (cm) 0.1 Area: (cm) 0.071 Volume: (cm) 0.007 % Reduction in Area: 99.7% % Reduction in Volume: 100% Epithelialization: None Wound Description Classification: Grade 1 Exudate Amount: Small Exudate Type: Serous Exudate Color: amber Foul Odor After Cleansing: No Slough/Fibrino No Wound Bed Granulation Amount: Large (67-100%) Exposed Structure Granulation Quality:  Pink Fascia Exposed: No Necrotic Amount: None Present (0%) Fat Layer (Subcutaneous Tissue) Exposed: No Tendon Exposed: No Muscle Exposed: No Joint Exposed: No Bone Exposed: No Limited to Skin Breakdown Treatment Notes Wound #1 (Lower Leg) Wound Laterality: Left, Lateral Cleanser Vashe 5.8 (oz) Discharge Instruction: Use vashe 5.8 (oz) as directed Peri-Wound Care Topical Gentamicin Discharge Instruction: Apply as directed by Layken Beg. Mupirocin Ointment Discharge Instruction: Apply as directed by Emil Weigold. Primary Dressing Hydrofera Blue Ready Transfer Foam, 2.5x2.5 (in/in) Discharge Instruction: Apply Hydrofera Blue Ready to wound bed as directed Secondary Dressing ABD Pad 5x9 (in/in) Discharge Instruction: Cover with ABD pad Secured With Compression Wrap Urgo K2 Lite, two layer compression system, large Compression Stockings Add-Ons Electronic Signature(s) Signed: 03/28/2023 4:46:32 PM By: Bonnell Public Entered By: Bonnell Public on 03/28/2023 13:32:35 Fernand Parkins A ([id number]) [id number] Page 6 of 6 -------------------------------------------------------------------------------- Vitals Details Patient Name: Date of Service: MO Cassandra Boss A. 03/28/2023 1:00 PM Medical Record Number: [id number] Patient Account Number: [id number] Date of Birth/Sex: Treating RN: [birth year] (72 y.o. [name], [name] Primary Care Ileen Kahre: Georgann Housekeeper Other Clinician: Referring Britania Shreeve: Treating Jaben Benegas/Extender: Orvan Seen Weeks in Treatment: 4 Vital Signs Time Taken: 13:25 Temperature (F): 97.8 Height (in): 62 Pulse (bpm): 109 Weight (lbs): 179 Respiratory Rate (breaths/min): 18 Body Mass Index (BMI): 32.7 Blood Pressure (mmHg): 112/71 Reference Range: 80 - 120 mg / dl Electronic Signature(s) Signed:  03/28/2023 4:46:32 PM By: Bonnell Public Entered By: Bonnell Public on 03/28/2023 13:26:45

## 2023-04-04 ENCOUNTER — Encounter: Payer: Medicare Other | Admitting: Internal Medicine

## 2023-04-04 DIAGNOSIS — I890 Lymphedema, not elsewhere classified: Secondary | ICD-10-CM

## 2023-04-04 DIAGNOSIS — Z7984 Long term (current) use of oral hypoglycemic drugs: Secondary | ICD-10-CM | POA: Diagnosis not present

## 2023-04-04 DIAGNOSIS — L97822 Non-pressure chronic ulcer of other part of left lower leg with fat layer exposed: Secondary | ICD-10-CM

## 2023-04-04 DIAGNOSIS — E11622 Type 2 diabetes mellitus with other skin ulcer: Secondary | ICD-10-CM | POA: Diagnosis not present
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
## 2023-04-04 NOTE — Progress Notes (Signed)
Cassandra Bridges, Cassandra Bridges ([id number]) [id number] Page 1 of 8 Visit Report for 04/04/2023 Arrival Information Details Patient Name: Date of Service: Cassandra Pattricia Boss Bridges. 04/04/2023 3:00 PM Medical Record Number: [id number] Patient Account Number: [id number] Date of Birth/Sex: Treating RN: 1951/02/08 (72 y.o. Freddy Finner Primary Care Valetta Mulroy: Georgann Housekeeper Other Clinician: Referring Obinna Ehresman: Treating Dasani Thurlow/Extender: [name] in Treatment: 5 Visit Information History Since Last Visit Added or deleted any medications: No Patient Arrived: Ambulatory Any new allergies or adverse reactions: No Arrival Time: 15:06 Had Bridges fall or experienced change in No Accompanied By: self activities of daily living that may affect Transfer Assistance: None risk of falls: Patient Identification Verified: Yes Signs or symptoms of abuse/neglect since last visito No Secondary Verification Process Completed: Yes Hospitalized since last visit: No Patient Requires Transmission-Based Precautions: No Implantable device outside of the clinic excluding No Patient Has Alerts: No cellular tissue based products placed in the center since last visit: Has Dressing in Place as Prescribed: Yes Has Compression in Place as Prescribed: Yes Pain Present Now: No Electronic Signature(s) Signed: 04/04/2023 4:21:14 PM By: Yevonne Pax RN Entered By: Yevonne Pax on 04/04/2023 15:07:13 -------------------------------------------------------------------------------- Clinic Level of Care Assessment Details Patient Name: Date of Service: Cassandra Pattricia Boss Bridges. 04/04/2023 3:00 PM Medical Record Number: [id number] Patient Account Number: [id number] Date of Birth/Sex: Treating RN: [birth year] (72 y.o. Freddy Finner Primary Care Treon Kehl: Georgann Housekeeper Other Clinician: Referring Siani Utke: Treating Laurencia Roma/Extender: [name] in Treatment:  5 Clinic Level of Care Assessment Items TOOL 4 Quantity Score X- 1 0 Use when only an EandM is performed on FOLLOW-UP visit ASSESSMENTS - Nursing Assessment / Reassessment []  - 0 Reassessment of Co-morbidities (includes updates in patient status) []  - 0 Reassessment of Adherence to Treatment Plan Cassandra Bridges, Cassandra Bridges ([id number]) [id number] Page 2 of 8 ASSESSMENTS - Wound and Skin Bridges ssessment / Reassessment X - Simple Wound Assessment / Reassessment - one wound 1 5 []  - 0 Complex Wound Assessment / Reassessment - multiple wounds []  - 0 Dermatologic / Skin Assessment (not related to wound area) ASSESSMENTS - Focused Assessment []  - 0 Circumferential Edema Measurements - multi extremities []  - 0 Nutritional Assessment / Counseling / Intervention []  - 0 Lower Extremity Assessment (monofilament, tuning fork, pulses) []  - 0 Peripheral Arterial Disease Assessment (using hand held doppler) ASSESSMENTS - Ostomy and/or Continence Assessment and Care []  - 0 Incontinence Assessment and Management []  - 0 Ostomy Care Assessment and Management (repouching, etc.) PROCESS - Coordination of Care X - Simple Patient / Family Education for ongoing care 1 15 []  - 0 Complex (extensive) Patient / Family Education for ongoing care []  - 0 Staff obtains Chiropractor, Records, T Results / Process Orders est []  - 0 Staff telephones HHA, Nursing Homes / Clarify orders / etc []  - 0 Routine Transfer to another Facility (non-emergent condition) []  - 0 Routine Hospital Admission (non-emergent condition) []  - 0 New Admissions / Manufacturing engineer / Ordering NPWT Apligraf, etc. , []  - 0 Emergency Hospital Admission (emergent condition) X- 1 10 Simple Discharge Coordination []  - 0 Complex (extensive) Discharge Coordination PROCESS - Special Needs []  - 0 Pediatric / Minor Patient Management []  - 0 Isolation Patient Management []  - 0 Hearing / Language / Visual special  needs []  - 0 Assessment of Community assistance (transportation, D/C planning, etc.) []  - 0 Additional assistance / Altered mentation []  - 0 Support Surface(s) Assessment (bed, cushion, seat, etc.) INTERVENTIONS -  Wound Cleansing / Measurement X - Simple Wound Cleansing - one wound 1 5 []  - 0 Complex Wound Cleansing - multiple wounds []  - 0 Wound Imaging (photographs - any number of wounds) []  - 0 Wound Tracing (instead of photographs) X- 1 5 Simple Wound Measurement - one wound []  - 0 Complex Wound Measurement - multiple wounds INTERVENTIONS - Wound Dressings []  - 0 Small Wound Dressing one or multiple wounds []  - 0 Medium Wound Dressing one or multiple wounds []  - 0 Large Wound Dressing one or multiple wounds []  - 0 Application of Medications - topical []  - 0 Application of Medications - injection INTERVENTIONS - Miscellaneous []  - 0 External ear exam Cassandra Bridges, Cassandra Bridges ([id number]) [id number] Page 3 of 8 []  - 0 Specimen Collection (cultures, biopsies, blood, body fluids, etc.) []  - 0 Specimen(s) / Culture(s) sent or taken to Lab for analysis []  - 0 Patient Transfer (multiple staff / Michiel Sites Lift / Similar devices) []  - 0 Simple Staple / Suture removal (25 or less) []  - 0 Complex Staple / Suture removal (26 or more) []  - 0 Hypo / Hyperglycemic Management (close monitor of Blood Glucose) []  - 0 Ankle / Brachial Index (ABI) - do not check if billed separately X- 1 5 Vital Signs Has the patient been seen at the hospital within the last three years: Yes Total Score: 45 Level Of Care: New/Established - Level 2 Electronic Signature(s) Signed: 04/04/2023 4:21:14 PM By: Yevonne Pax RN Entered By: Yevonne Pax on 04/04/2023 15:53:36 -------------------------------------------------------------------------------- Encounter Discharge Information Details Patient Name: Date of Service: Cassandra Venetia Constable H Bridges. 04/04/2023 3:00 PM Medical Record  Number: [id number] Patient Account Number: [id number] Date of Birth/Sex: Treating RN: [birth year] (72 y.o. Freddy Finner Primary Care Gerry Heaphy: Georgann Housekeeper Other Clinician: Referring Carolann Brazell: Treating Sicilia Killough/Extender: [name] in Treatment: 5 Encounter Discharge Information Items Discharge Condition: Stable Ambulatory Status: Ambulatory Discharge Destination: Home Transportation: Private Auto Accompanied By: self Schedule Follow-up Appointment: Yes Clinical Summary of Care: Electronic Signature(s) Signed: 04/04/2023 4:21:14 PM By: Yevonne Pax RN Entered By: Yevonne Pax on 04/04/2023 15:54:54 Lower Extremity Assessment Details -------------------------------------------------------------------------------- Fernand Parkins Bridges ([id number]) [id number] Page 4 of 8 Patient Name: Date of Service: Cassandra Pattricia Boss Bridges. 04/04/2023 3:00 PM Medical Record Number: [id number] Patient Account Number: [id number] Date of Birth/Sex: Treating RN: 1951/01/28 (72 y.o. Freddy Finner Primary Care Tobenna Needs: Georgann Housekeeper Other Clinician: Referring Anyra Kaufman: Treating Ewing Fandino/Extender: Orvan Seen Weeks in Treatment: 5 Electronic Signature(s) Signed: 04/04/2023 4:21:14 PM By: Yevonne Pax RN Entered By: Yevonne Pax on 04/04/2023 15:52:35 -------------------------------------------------------------------------------- Multi Wound Chart Details Patient Name: Date of Service: Cassandra Venetia Constable H Bridges. 04/04/2023 3:00 PM Medical Record Number: [id number] Patient Account Number: [id number] Date of Birth/Sex: Treating RN: [birth year] (72 y.o. Freddy Finner Primary Care Caylea Foronda: Georgann Housekeeper Other Clinician: Referring Alayshia Marini: Treating [place] Ice/Extender: Orvan Seen Weeks in Treatment: 5 Vital Signs Height(in): 62 Pulse(bpm): 94 Weight(lbs): 179 Blood Pressure(mmHg): 135/66 Body Mass Index(BMI):  32.7 Temperature(F): 97.7 Respiratory Rate(breaths/min): 18 [1:Photos:] [Bridges/Bridges:Bridges/Bridges] Left, Lateral Lower Leg Bridges/Bridges Bridges/Bridges Wound Location: Trauma Bridges/Bridges Bridges/Bridges Wounding Event: Diabetic Wound/Ulcer of the Lower Bridges/Bridges Bridges/Bridges Primary Etiology: Extremity Hypertension, Type II Diabetes, Bridges/Bridges Bridges/Bridges Comorbid History: Rheumatoid Arthritis 02/03/2023 Bridges/Bridges Bridges/Bridges Date Acquired: 5 Bridges/Bridges Bridges/Bridges Weeks of Treatment: Healed - Epithelialized Bridges/Bridges Bridges/Bridges Wound Status: No Bridges/Bridges Bridges/Bridges Wound Recurrence: 0x0x0 Bridges/Bridges Bridges/Bridges Measurements L x W x D (cm) 0 Bridges/Bridges Bridges/Bridges Bridges (cm) : rea 0 Bridges/Bridges  Bridges/Bridges Volume (cm) : 100.00% Bridges/Bridges Bridges/Bridges % Reduction in Bridges rea: 100.00% Bridges/Bridges Bridges/Bridges % Reduction in Volume: Grade 1 Bridges/Bridges Bridges/Bridges Classification: None Present Bridges/Bridges Bridges/Bridges Exudate Bridges mount: None Present (0%) Bridges/Bridges Bridges/Bridges Granulation Bridges mount: None Present (0%) Bridges/Bridges Bridges/Bridges Necrotic Bridges mount: Fascia: No Bridges/Bridges Bridges/Bridges Exposed Structures: Fat Layer (Subcutaneous Tissue): No Tendon: No Muscle: No Joint: No Bone: No Cassandra Bridges, Cassandra Bridges ([id number]) [id number] Page 5 of 8 None Bridges/Bridges Bridges/Bridges Epithelialization: Treatment Notes Wound #1 (Lower Leg) Wound Laterality: Left, Lateral Cleanser Peri-Wound Care Topical Primary Dressing Secondary Dressing Secured With Compression Wrap Compression Stockings Add-Ons Electronic Signature(s) Signed: 04/04/2023 4:29:16 PM By: Geralyn Corwin DO Entered By: Geralyn Corwin on 04/04/2023 16:14:50 -------------------------------------------------------------------------------- Multi-Disciplinary Care Plan Details Patient Name: Date of Service: Cassandra Venetia Constable H Bridges. 04/04/2023 3:00 PM Medical Record Number: [id number] Patient Account Number: [id number] Date of Birth/Sex: Treating RN: 13-Jun-1951 (72 y.o. Freddy Finner Primary Care Madason Rauls: Georgann Housekeeper Other Clinician: Referring Sir Mallis: Treating Sheliah Fiorillo/Extender: Orvan Seen Weeks in Treatment: 5 Active Inactive Electronic Signature(s) Signed: 04/04/2023  4:21:14 PM By: Yevonne Pax RN Entered By: Yevonne Pax on 04/04/2023 15:53:54 -------------------------------------------------------------------------------- Pain Assessment Details Patient Name: Date of Service: Cassandra Bridges, Cassandra Bridges. 04/04/2023 3:00 PM Cassandra Bridges, Cassandra Bridges ([id number]) [id number].pdf Page 6 of 8 Medical Record Number: [id number] Patient Account Number: [id number] Date of Birth/Sex: Treating RN: [birth year] (72 y.o. Freddy Finner Primary Care Raihana Balderrama: Georgann Housekeeper Other Clinician: Referring Lauris Keepers: Treating Olesya Wike/Extender: Orvan Seen Weeks in Treatment: 5 Active Problems Location of Pain Severity and Description of Pain Patient Has Paino No Site Locations Pain Management and Medication Current Pain Management: Electronic Signature(s) Signed: 04/04/2023 4:21:14 PM By: Yevonne Pax RN Entered By: Yevonne Pax on 04/04/2023 15:10:44 -------------------------------------------------------------------------------- Patient/Caregiver Education Details Patient Name: Date of Service: Cassandra Pattricia Boss Bridges. 7/31/2024andnbsp3:00 PM Medical Record Number: [id number] Patient Account Number: [id number] Date of Birth/Gender: Treating RN: 11/25/1950 (72 y.o. Freddy Finner Primary Care Physician: Georgann Housekeeper Other Clinician: Referring Physician: Treating Physician/Extender: [name] in Treatment: 5 Education Assessment Education Provided To: Patient Education Topics Provided Wound/Skin Impairment: Handouts: Other: discharge instructions Methods: Explain/Verbal Responses: State content correctly Cassandra Bridges, Cassandra Bridges ([id number]) [id number] Page 7 of 8 Electronic Signature(s) Signed: 04/04/2023 4:21:14 PM By: Yevonne Pax RN Entered By: Yevonne Pax on 04/04/2023 15:54:16 -------------------------------------------------------------------------------- Wound Assessment  Details Patient Name: Date of Service: Cassandra Pattricia Boss Bridges. 04/04/2023 3:00 PM Medical Record Number: [id number] Patient Account Number: [id number] Date of Birth/Sex: Treating RN: [birth year] (72 y.o. Freddy Finner Primary Care Trinitie Mcgirr: Georgann Housekeeper Other Clinician: Referring Donatello Kleve: Treating Jovian Lembcke/Extender: Orvan Seen Weeks in Treatment: 5 Wound Status Wound Number: 1 Primary Etiology: Diabetic Wound/Ulcer of the Lower Extremity Wound Location: Left, Lateral Lower Leg Wound Status: Healed - Epithelialized Wounding Event: Trauma Comorbid History: Hypertension, Type II Diabetes, Rheumatoid Arthritis Date Acquired: 02/03/2023 Weeks Of Treatment: 5 Clustered Wound: No Photos Wound Measurements Length: (cm) Width: (cm) Depth: (cm) Area: (cm) Volume: (cm) 0 % Reduction in Area: 100% 0 % Reduction in Volume: 100% 0 Epithelialization: None 0 Tunneling: No 0 Undermining: No Wound Description Classification: Grade 1 Exudate Amount: None Present Foul Odor After Cleansing: No Slough/Fibrino No Wound Bed Granulation Amount: None Present (0%) Exposed Structure Necrotic Amount: None Present (0%) Fascia Exposed: No Fat Layer (Subcutaneous Tissue) Exposed: No Tendon Exposed: No Muscle Exposed: No Joint Exposed: No Bone Exposed: No Treatment Notes Wound #1 (Lower Leg) Wound Laterality:  Left, Lateral Cassandra Bridges, Cassandra Bridges ([id number]) [id number] Page 8 of 8 Cleanser Peri-Wound Care Topical Primary Dressing Secondary Dressing Secured With Compression Wrap Compression Stockings Add-Ons Electronic Signature(s) Signed: 04/04/2023 4:21:14 PM By: Yevonne Pax RN Entered By: Yevonne Pax on 04/04/2023 15:36:59 -------------------------------------------------------------------------------- Vitals Details Patient Name: Date of Service: Cassandra [name], Cassandra Bridges. 04/04/2023 3:00 PM Medical Record Number: [id number] Patient Account  Number: [id number] Date of Birth/Sex: Treating RN: 1951/04/05 (72 y.o. Freddy Finner Primary Care Brighten Buzzelli: Georgann Housekeeper Other Clinician: Referring Akaash Vandewater: Treating Marlita Keil/Extender: Orvan Seen Weeks in Treatment: 5 Vital Signs Time Taken: 15:07 Temperature (F): 97.7 Height (in): 62 Pulse (bpm): 94 Weight (lbs): 179 Respiratory Rate (breaths/min): 18 Body Mass Index (BMI): 32.7 Blood Pressure (mmHg): 135/66 Reference Range: 80 - 120 mg / dl Electronic Signature(s) Signed: 04/04/2023 4:21:14 PM By: Yevonne Pax RN Entered By: Yevonne Pax on 04/04/2023 15:10:38
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
## 2023-04-10 NOTE — Progress Notes (Signed)
ORIANE, WIELGUS A ([id number]) [id number] Page 1 of 8 Visit Report for 03/07/2023 Arrival Information Details Patient Name: Date of Service: Cassandra Pattricia Boss A. 03/07/2023 12:45 PM Medical Record Number: [id number] Patient Account Number: [id number] Date of Birth/Sex: Treating RN: 12-Nov-1950 (72 y.o. Freddy Finner Primary Care Makarios Madlock: Georgann Housekeeper Other Clinician: Referring Kimaya Whitlatch: Treating [place] Gipe/Extender: [name] in Treatment: 1 Visit Information History Since Last Visit Added or deleted any medications: No Patient Arrived: Ambulatory Any new allergies or adverse reactions: No Arrival Time: 12:58 Had a fall or experienced change in No Accompanied By: self activities of daily living that may affect Transfer Assistance: None risk of falls: Patient Identification Verified: Yes Signs or symptoms of abuse/neglect since last visito No Secondary Verification Process Completed: Yes Hospitalized since last visit: No Patient Requires Transmission-Based Precautions: No Implantable device outside of the clinic excluding No Patient Has Alerts: No cellular tissue based products placed in the center since last visit: Has Dressing in Place as Prescribed: Yes Has Compression in Place as Prescribed: Yes Pain Present Now: No Electronic Signature(s) Signed: 04/10/2023 3:49:02 PM By: Yevonne Pax RN Entered By: Yevonne Pax on 03/07/2023 12:58:43 -------------------------------------------------------------------------------- Clinic Level of Care Assessment Details Patient Name: Date of Service: Cassandra Pattricia Boss A. 03/07/2023 12:45 PM Medical Record Number: [id number] Patient Account Number: [id number] Date of Birth/Sex: Treating RN: 10-04-1950 (72 y.o. Freddy Finner Primary Care Delila Kuklinski: Georgann Housekeeper Other Clinician: Referring Ravenna Legore: Treating Kadyn Chovan/Extender: [name] in Treatment:  1 Clinic Level of Care Assessment Items TOOL 1 Quantity Score []  - 0 Use when EandM and Procedure is performed on INITIAL visit ASSESSMENTS - Nursing Assessment / Reassessment []  - 0 General Physical Exam (combine w/ comprehensive assessment (listed just below) when performed on new pt. evals) []  - 0 Comprehensive Assessment (HX, ROS, Risk Assessments, Wounds Hx, etc.) STIRLING, RUSTAD A ([id number]) [id number] Page 2 of 8 ASSESSMENTS - Wound and Skin Assessment / Reassessment []  - 0 Dermatologic / Skin Assessment (not related to wound area) ASSESSMENTS - Ostomy and/or Continence Assessment and Care []  - 0 Incontinence Assessment and Management []  - 0 Ostomy Care Assessment and Management (repouching, etc.) PROCESS - Coordination of Care []  - 0 Simple Patient / Family Education for ongoing care []  - 0 Complex (extensive) Patient / Family Education for ongoing care []  - 0 Staff obtains Chiropractor, Records, T Results / Process Orders est []  - 0 Staff telephones HHA, Nursing Homes / Clarify orders / etc []  - 0 Routine Transfer to another Facility (non-emergent condition) []  - 0 Routine Hospital Admission (non-emergent condition) []  - 0 New Admissions / Manufacturing engineer / Ordering NPWT Apligraf, etc. , []  - 0 Emergency Hospital Admission (emergent condition) PROCESS - Special Needs []  - 0 Pediatric / Minor Patient Management []  - 0 Isolation Patient Management []  - 0 Hearing / Language / Visual special needs []  - 0 Assessment of Community assistance (transportation, D/C planning, etc.) []  - 0 Additional assistance / Altered mentation []  - 0 Support Surface(s) Assessment (bed, cushion, seat, etc.) INTERVENTIONS - Miscellaneous []  - 0 External ear exam []  - 0 Patient Transfer (multiple staff / Nurse, adult / Similar devices) []  - 0 Simple Staple / Suture removal (25 or less) []  - 0 Complex Staple / Suture removal (26 or more) []  -  0 Hypo/Hyperglycemic Management (do not check if billed separately) []  - 0 Ankle / Brachial Index (ABI) - do not check if billed separately Has  the patient been seen at the hospital within the last three years: Yes Total Score: 0 Level Of Care: ____ Electronic Signature(s) Signed: 04/10/2023 3:49:02 PM By: Yevonne Pax RN Entered By: Yevonne Pax on 03/07/2023 13:12:47 -------------------------------------------------------------------------------- Encounter Discharge Information Details Patient Name: Date of Service: Cassandra Venetia Constable H A. 03/07/2023 12:45 PM Medical Record Number: [id number] Patient Account Number: [id number] Date of Birth/Sex: Treating RN: 11-May-1951 (72 y.o. Freddy Finner Primary Care Eisley Barber: Georgann Housekeeper Other Clinician: Referring Elmor Kost: Treating Liahm Grivas/Extender: Orvan Seen [place], [place] A ([id number]) [phone].pdf Page 3 of 8 Weeks in Treatment: 1 Encounter Discharge Information Items Post Procedure Vitals Discharge Condition: Stable Temperature (F): 97.7 Ambulatory Status: Ambulatory Pulse (bpm): 96 Discharge Destination: Home Respiratory Rate (breaths/min): 18 Transportation: Private Auto Blood Pressure (mmHg): 128/81 Accompanied By: self Schedule Follow-up Appointment: Yes Clinical Summary of Care: Electronic Signature(s) Signed: 04/10/2023 3:49:02 PM By: Yevonne Pax RN Entered By: Yevonne Pax on 03/07/2023 13:13:21 -------------------------------------------------------------------------------- Lower Extremity Assessment Details Patient Name: Date of Service: Cassandra Pattricia Boss A. 03/07/2023 12:45 PM Medical Record Number: [id number] Patient Account Number: [id number] Date of Birth/Sex: Treating RN: [birth year] (72 y.o. Freddy Finner Primary Care Javani Spratt: Georgann Housekeeper Other Clinician: Referring Jayven Naill: Treating Barnet Benavides/Extender: Orvan Seen Weeks in Treatment: 1 Edema  Assessment Assessed: [Left: No] [Right: No] Edema: [Left: Yes] [Right: No] Calf Left: Right: Point of Measurement: 34 cm From Medial Instep 41 cm Ankle Left: Right: Point of Measurement: 10 cm From Medial Instep 24.5 cm Vascular Assessment Pulses: Dorsalis Pedis Palpable: [Left:Yes] Electronic Signature(s) Signed: 04/10/2023 3:49:02 PM By: Yevonne Pax RN Entered By: Yevonne Pax on 03/07/2023 13:04:13 Fernand Parkins A ([id number]) [id number] Page 4 of 8 -------------------------------------------------------------------------------- Multi Wound Chart Details Patient Name: Date of Service: Cassandra Pattricia Boss A. 03/07/2023 12:45 PM Medical Record Number: [id number] Patient Account Number: [id number] Date of Birth/Sex: Treating RN: 06-02-1951 (72 y.o. Freddy Finner Primary Care Markes Shatswell: Georgann Housekeeper Other Clinician: Referring Bilbo Carcamo: Treating Eunique Balik/Extender: Orvan Seen Weeks in Treatment: 1 Vital Signs Height(in): 62 Pulse(bpm): 96 Weight(lbs): 179 Blood Pressure(mmHg): 128/81 Body Mass Index(BMI): 32.7 Temperature(F): 97.7 Respiratory Rate(breaths/min): 18 [1:Photos:] [N/A:N/A] Left, Lateral Lower Leg N/A N/A Wound Location: Trauma N/A N/A Wounding Event: Diabetic Wound/Ulcer of the Lower N/A N/A Primary Etiology: Extremity Hypertension, Type II Diabetes, N/A N/A Comorbid History: Rheumatoid Arthritis 02/03/2023 N/A N/A Date Acquired: 1 N/A N/A Weeks of Treatment: Open N/A N/A Wound Status: No N/A N/A Wound Recurrence: 6x6x1.1 N/A N/A Measurements L x W x D (cm) 28.274 N/A N/A A (cm) : rea 31.102 N/A N/A Volume (cm) : 0.00% N/A N/A % Reduction in A rea: -83.30% N/A N/A % Reduction in Volume: Grade 1 N/A N/A Classification: Medium N/A N/A Exudate A mount: Serosanguineous N/A N/A Exudate Type: red, brown N/A N/A Exudate Color: Medium (34-66%) N/A N/A Granulation A mount: Red N/A  N/A Granulation Quality: Medium (34-66%) N/A N/A Necrotic A mount: Fat Layer (Subcutaneous Tissue): Yes N/A N/A Exposed Structures: Fascia: No Tendon: No Muscle: No Joint: No Bone: No None N/A N/A Epithelialization: Treatment Notes Electronic Signature(s) Signed: 04/10/2023 3:49:02 PM By: Yevonne Pax RN Entered By: Yevonne Pax on 03/07/2023 13:04:20 Fernand Parkins A ([id number]) [id number] Page 5 of 8 -------------------------------------------------------------------------------- Multi-Disciplinary Care Plan Details Patient Name: Date of Service: Cassandra Pattricia Boss A. 03/07/2023 12:45 PM Medical Record Number: [id number] Patient Account Number: [id number] Date of Birth/Sex: Treating RN: 14-Jul-1951 (72 y.o. Freddy Finner Primary Care Adrick Kestler: Georgann Housekeeper  Other Clinician: Referring Loron Weimer: Treating Ewell Benassi/Extender: [name] in Treatment: 1 Active Inactive Necrotic Tissue Nursing Diagnoses: Knowledge deficit related to management of necrotic/devitalized tissue Goals: Necrotic/devitalized tissue will be minimized in the wound bed Date Initiated: 02/28/2023 Target Resolution Date: 03/30/2023 Goal Status: Active Interventions: Assess patient pain level pre-, during and post procedure and prior to discharge Notes: Wound/Skin Impairment Nursing Diagnoses: Knowledge deficit related to ulceration/compromised skin integrity Goals: Patient/caregiver will verbalize understanding of skin care regimen Date Initiated: 02/28/2023 Target Resolution Date: 03/30/2023 Goal Status: Active Ulcer/skin breakdown will have a volume reduction of 30% by week 4 Date Initiated: 02/28/2023 Target Resolution Date: 03/30/2023 Goal Status: Active Ulcer/skin breakdown will have a volume reduction of 50% by week 8 Date Initiated: 02/28/2023 Target Resolution Date: 04/30/2023 Goal Status: Active Ulcer/skin breakdown will have a volume  reduction of 80% by week 12 Date Initiated: 02/28/2023 Target Resolution Date: 05/31/2023 Goal Status: Active Ulcer/skin breakdown will heal within 14 weeks Date Initiated: 02/28/2023 Target Resolution Date: 06/30/2023 Goal Status: Active Interventions: Assess patient/caregiver ability to obtain necessary supplies Assess patient/caregiver ability to perform ulcer/skin care regimen upon admission and as needed Assess ulceration(s) every visit Notes: Electronic Signature(s) Signed: 04/10/2023 3:49:02 PM By: Yevonne Pax RN Entered By: Yevonne Pax on 03/07/2023 13:04:30 Fernand Parkins A ([id number]) [id number] Page 6 of 8 -------------------------------------------------------------------------------- Pain Assessment Details Patient Name: Date of Service: Cassandra Pattricia Boss A. 03/07/2023 12:45 PM Medical Record Number: [id number] Patient Account Number: [id number] Date of Birth/Sex: Treating RN: [birth year] (72 y.o. Freddy Finner Primary Care Dyer Klug: Georgann Housekeeper Other Clinician: Referring Tanayia Wahlquist: Treating Kylyn Mcdade/Extender: Orvan Seen Weeks in Treatment: 1 Active Problems Location of Pain Severity and Description of Pain Patient Has Paino No Site Locations Pain Management and Medication Current Pain Management: Electronic Signature(s) Signed: 04/10/2023 3:49:02 PM By: Yevonne Pax RN Entered By: Yevonne Pax on 03/07/2023 12:59:09 -------------------------------------------------------------------------------- Patient/Caregiver Education Details Patient Name: Date of Service: Cassandra Pattricia Boss A. 7/3/2024andnbsp12:45 PM Medical Record Number: [id number] Patient Account Number: [id number] Date of Birth/Gender: Treating RN: September 25, 1950 (72 y.o. Freddy Finner Primary Care Physician: Georgann Housekeeper Other Clinician: Referring Physician: Treating Physician/Extender: [name] in Treatment: 1 Neace,  [facility] A ([id number]) [id number] Page 7 of 8 Education Assessment Education Provided To: Patient Education Topics Provided Wound Debridement: Handouts: Wound Debridement Methods: Explain/Verbal Responses: State content correctly Electronic Signature(s) Signed: 04/10/2023 3:49:02 PM By: Yevonne Pax RN Entered By: Yevonne Pax on 03/07/2023 13:04:42 -------------------------------------------------------------------------------- Wound Assessment Details Patient Name: Date of Service: Cassandra Pattricia Boss A. 03/07/2023 12:45 PM Medical Record Number: [id number] Patient Account Number: [id number] Date of Birth/Sex: Treating RN: August 22, 1951 (72 y.o. Freddy Finner Primary Care Lamberto Dinapoli: Georgann Housekeeper Other Clinician: Referring Jaysten Essner: Treating Daneesha Quinteros/Extender: Orvan Seen Weeks in Treatment: 1 Wound Status Wound Number: 1 Primary Etiology: Diabetic Wound/Ulcer of the Lower Extremity Wound Location: Left, Lateral Lower Leg Wound Status: Open Wounding Event: Trauma Comorbid History: Hypertension, Type II Diabetes, Rheumatoid Arthritis Date Acquired: 02/03/2023 Weeks Of Treatment: 1 Clustered Wound: No Photos Wound Measurements Length: (cm) 6 Width: (cm) 6 Depth: (cm) 1.1 Area: (cm) 28.274 Volume: (cm) 31.102 % Reduction in Area: 0% % Reduction in Volume: -83.3% Epithelialization: None Tunneling: No Undermining: No Wound Description Classification: Grade 1 Exudate Amount: Medium Exudate Type: Serosanguineous Vera, Enas A ([id number]) Exudate Color: red, brown Foul Odor After Cleansing: No Slough/Fibrino Yes [id number] Page 8 of 8 Wound Bed Granulation Amount: Medium (34-66%) Exposed Structure Granulation  Quality: Red Fascia Exposed: No Necrotic Amount: Medium (34-66%) Fat Layer (Subcutaneous Tissue) Exposed: Yes Necrotic Quality: Adherent Slough Tendon Exposed: No Muscle Exposed: No Joint  Exposed: No Bone Exposed: No Electronic Signature(s) Signed: 04/10/2023 3:49:02 PM By: Yevonne Pax RN Entered By: Yevonne Pax on 03/07/2023 13:03:35 -------------------------------------------------------------------------------- Vitals Details Patient Name: Date of Service: Cassandra Venetia Constable H A. 03/07/2023 12:45 PM Medical Record Number: [id number] Patient Account Number: [id number] Date of Birth/Sex: Treating RN: Mar 25, 1951 (72 y.o. Freddy Finner Primary Care Cinthia Rodden: Georgann Housekeeper Other Clinician: Referring Kaylor Simenson: Treating Brayon Bielefeld/Extender: Orvan Seen Weeks in Treatment: 1 Vital Signs Time Taken: 12:58 Temperature (F): 97.7 Height (in): 62 Pulse (bpm): 96 Weight (lbs): 179 Respiratory Rate (breaths/min): 18 Body Mass Index (BMI): 32.7 Blood Pressure (mmHg): 128/81 Reference Range: 80 - 120 mg / dl Electronic Signature(s) Signed: 04/10/2023 3:49:02 PM By: Yevonne Pax RN Entered By: Yevonne Pax on 03/07/2023 12:58:59

## 2023-07-10 DIAGNOSIS — J45909 Unspecified asthma, uncomplicated: Secondary | ICD-10-CM | POA: Diagnosis not present

## 2023-07-10 DIAGNOSIS — M87052 Idiopathic aseptic necrosis of left femur: Secondary | ICD-10-CM | POA: Diagnosis not present

## 2023-07-10 DIAGNOSIS — E113593 Type 2 diabetes mellitus with proliferative diabetic retinopathy without macular edema, bilateral: Secondary | ICD-10-CM | POA: Diagnosis not present

## 2023-07-10 DIAGNOSIS — G8929 Other chronic pain: Secondary | ICD-10-CM | POA: Diagnosis not present

## 2023-07-10 DIAGNOSIS — M169 Osteoarthritis of hip, unspecified: Secondary | ICD-10-CM | POA: Diagnosis not present

## 2023-07-10 DIAGNOSIS — Z86711 Personal history of pulmonary embolism: Secondary | ICD-10-CM | POA: Diagnosis not present

## 2023-07-10 DIAGNOSIS — I5032 Chronic diastolic (congestive) heart failure: Secondary | ICD-10-CM | POA: Diagnosis not present

## 2023-07-10 DIAGNOSIS — Z0000 Encounter for general adult medical examination without abnormal findings: Secondary | ICD-10-CM | POA: Diagnosis not present

## 2023-07-10 DIAGNOSIS — E785 Hyperlipidemia, unspecified: Secondary | ICD-10-CM | POA: Diagnosis not present

## 2023-07-10 DIAGNOSIS — Z23 Encounter for immunization: Secondary | ICD-10-CM | POA: Diagnosis not present

## 2023-07-10 DIAGNOSIS — I10 Essential (primary) hypertension: Secondary | ICD-10-CM | POA: Diagnosis not present

## 2023-07-10 DIAGNOSIS — E1140 Type 2 diabetes mellitus with diabetic neuropathy, unspecified: Secondary | ICD-10-CM | POA: Diagnosis not present

## 2023-07-17 ENCOUNTER — Other Ambulatory Visit: Payer: Self-pay | Admitting: Internal Medicine

## 2023-07-17 DIAGNOSIS — Z1231 Encounter for screening mammogram for malignant neoplasm of breast: Secondary | ICD-10-CM

## 2023-08-17 ENCOUNTER — Ambulatory Visit: Payer: Medicare Other

## 2023-09-11 ENCOUNTER — Ambulatory Visit: Payer: Medicare Other

## 2023-09-20 ENCOUNTER — Ambulatory Visit
Admission: RE | Admit: 2023-09-20 | Discharge: 2023-09-20 | Disposition: A | Discharge status: Home / Self Care | Payer: 59 | Source: Ambulatory Visit | Attending: Internal Medicine | Admitting: Internal Medicine

## 2023-09-20 DIAGNOSIS — Z1231 Encounter for screening mammogram for malignant neoplasm of breast: Secondary | ICD-10-CM

## 2023-12-31 DIAGNOSIS — R509 Fever, unspecified: Secondary | ICD-10-CM | POA: Diagnosis not present

## 2023-12-31 DIAGNOSIS — R051 Acute cough: Secondary | ICD-10-CM | POA: Diagnosis not present

## 2023-12-31 DIAGNOSIS — S20363A Insect bite (nonvenomous) of bilateral front wall of thorax, initial encounter: Secondary | ICD-10-CM | POA: Diagnosis not present

## 2024-01-08 ENCOUNTER — Other Ambulatory Visit: Payer: Self-pay | Admitting: Internal Medicine

## 2024-01-08 DIAGNOSIS — R269 Unspecified abnormalities of gait and mobility: Secondary | ICD-10-CM | POA: Diagnosis not present

## 2024-01-08 DIAGNOSIS — E113593 Type 2 diabetes mellitus with proliferative diabetic retinopathy without macular edema, bilateral: Secondary | ICD-10-CM | POA: Diagnosis not present

## 2024-01-08 DIAGNOSIS — G8929 Other chronic pain: Secondary | ICD-10-CM | POA: Diagnosis not present

## 2024-01-08 DIAGNOSIS — E1140 Type 2 diabetes mellitus with diabetic neuropathy, unspecified: Secondary | ICD-10-CM | POA: Diagnosis not present

## 2024-01-08 DIAGNOSIS — E785 Hyperlipidemia, unspecified: Secondary | ICD-10-CM | POA: Diagnosis not present

## 2024-01-08 DIAGNOSIS — Z86711 Personal history of pulmonary embolism: Secondary | ICD-10-CM | POA: Diagnosis not present

## 2024-01-08 DIAGNOSIS — I5032 Chronic diastolic (congestive) heart failure: Secondary | ICD-10-CM | POA: Diagnosis not present

## 2024-01-08 DIAGNOSIS — I10 Essential (primary) hypertension: Secondary | ICD-10-CM | POA: Diagnosis not present

## 2024-01-15 ENCOUNTER — Ambulatory Visit
Admission: RE | Admit: 2024-01-15 | Discharge: 2024-01-15 | Disposition: A | Discharge status: Home / Self Care | Source: Ambulatory Visit | Attending: Internal Medicine | Admitting: Internal Medicine

## 2024-01-15 DIAGNOSIS — R9082 White matter disease, unspecified: Secondary | ICD-10-CM | POA: Diagnosis not present

## 2024-01-15 DIAGNOSIS — R269 Unspecified abnormalities of gait and mobility: Secondary | ICD-10-CM

## 2024-01-15 DIAGNOSIS — I6389 Other cerebral infarction: Secondary | ICD-10-CM | POA: Diagnosis not present

## 2024-02-08 ENCOUNTER — Telehealth: Payer: Self-pay | Admitting: Neurology

## 2024-02-08 ENCOUNTER — Encounter: Payer: Self-pay | Admitting: Neurology

## 2024-02-08 NOTE — Telephone Encounter (Signed)
 Pt. Dr. Kimble Pennant her on Plavix 75 and she wants to know should she start taking it prior to appt with [facility] on 17th or should she wait

## 2024-02-08 NOTE — Telephone Encounter (Signed)
 Pt called no answer left a voice mail to call the office back ; when she calls back asking who started her on the plavix

## 2024-02-11 NOTE — Telephone Encounter (Signed)
Called pt and unable to leave voicemail 

## 2024-02-12 NOTE — Progress Notes (Deleted)
 Initial neurology clinic note  Reason for Evaluation: Consultation requested by Husain, Karrar, MD for an opinion regarding gait abnormality. My final recommendations will be communicated back to the requesting physician by way of shared medical record or letter to requesting physician via [place]  mail.  HPI: This is Ms. Cassandra Bridges, a 73 y.o. ***-handed female with a medical history of HTN, HLD, CHF, DM c/b neuropathy and retinopathy, PE, chronic pain, anxiety, depression, OA, avascular necrosis of left hip, asthma*** who presents to neurology clinic with the chief complaint of ***. The patient is accompanied by ***.  *** Gait abnormality and ataxia - poor balance CT head on 01/15/24 showed age indeterminate, likely acute/subacute stroke in left superior cerebellum. There was also a remote lacunar infarct in posterior lentiform nucleus on right.  When referral was sent, I recommended patient be seen at ED due to concern for acute stroke.  On gabapentin 300 mg BID Crestor  20 mg daily Cymbalta 90 mg daily?  Aspirin? Plavix?  The patient has not*** had similar episodes of symptoms in the past. ***  Muscle bulk loss? *** Muscle pain? ***  Cramps/Twitching? *** Suggestion of myotonia/difficulty relaxing after contraction? ***  Fatigable weakness?*** Does strength improve after brief exercise?***  Able to brush hair/teeth without difficulty? *** Able to button shirts/use zips? *** Clumsiness/dropping grasped objects?*** Can you arise from squatted position easily? *** Able to get out of chair without using arms? *** Able to walk up steps easily? *** Use an assistive device to walk? *** Significant imbalance with walking? *** Falls?*** Any change in urine color, especially after exertion/physical activity? ***  The patient denies*** symptoms suggestive of oculobulbar weakness including diplopia, ptosis, dysphagia, poor saliva control, dysarthria/dysphonia, impaired mastication, facial  weakness/droop.  There are no*** neuromuscular respiratory weakness symptoms, particularly orthopnea>dyspnea.   Pseudobulbar affect is absent***.  The patient does not*** report symptoms referable to autonomic dysfunction including impaired sweating, heat or cold intolerance, excessive mucosal dryness, gastroparetic early satiety, postprandial abdominal bloating, constipation, bowel or bladder dyscontrol, erectile dysfunction*** or syncope/presyncope/orthostatic intolerance.  There are no*** complaints relating to other symptoms of small fiber modalities including paresthesia/pain.  The patient has not *** noticed any recent skin rashes nor does he*** report any constitutional symptoms like fever, night sweats, anorexia or unintentional weight loss.  EtOH use: ***  Restrictive diet? *** Family history of neuropathy/myopathy/NM disease?***  Previous labs, electrodiagnostics, and neuroimaging are summarized below, but pertinent findings include***  Any biopsy done? *** Current medications being tried for the patient's symptoms include ***  Prior medications that have been tried: ***   MEDICATIONS:  Outpatient Encounter Medications as of 02/21/2024  Medication Sig   albuterol (VENTOLIN HFA) 108 (90 Base) MCG/ACT inhaler    furosemide  (LASIX ) 40 MG tablet TAKE 1 TABLET BY MOUTH  DAILY . MAY TAKE 1 EXTRA  TAB AS NEEDED FOR WEIGHT  GAIN OR SHORTNESS OF  BREATH. MAX 2 TABS PER DAY   glipiZIDE (GLUCOTROL) 5 MG tablet Take 5 mg by mouth 2 (two) times daily before a meal.    JARDIANCE 10 MG TABS tablet Take 10 mg by mouth daily.   ketoconazole (NIZORAL) 2 % cream Apply 1 application topically 2 (two) times daily.   montelukast (SINGULAIR) 10 MG tablet Take 10 mg by mouth daily.   rosuvastatin  (CRESTOR ) 40 MG tablet Take 1 tablet (40 mg total) by mouth daily.   traMADol (ULTRAM) 50 MG tablet Take 50 mg by mouth every 8 (eight) hours as needed  for pain.   No facility-administered encounter  medications on file as of 02/21/2024.    PAST MEDICAL HISTORY: Past Medical History:  Diagnosis Date   Arthritis    Diabetes mellitus without complication (HCC)    Hypertension    PE (pulmonary thromboembolism) (HCC) 2007    PAST SURGICAL HISTORY: Past Surgical History:  Procedure Laterality Date   OTHER SURGICAL HISTORY  2008   left hip replacement   PARTIAL HYSTERECTOMY     TOTAL HIP ARTHROPLASTY      ALLERGIES: Allergies  Allergen Reactions   Effexor [Venlafaxine] Other (See Comments)    Altered mental status   Penicillins Swelling    Has patient had a PCN reaction causing immediate rash, facial/tongue/throat swelling, SOB or lightheadedness with hypotension: Yes Has patient had a PCN reaction causing severe rash involving mucus membranes or skin necrosis: Unknown Has patient had a PCN reaction that required hospitalization: No Has patient had a PCN reaction occurring within the last 10 years: No If all of the above answers are "NO", then may proceed with Cephalosporin use.\    FAMILY HISTORY: Family History  Problem Relation Age of Onset   Cancer Mother    Heart attack Father     SOCIAL HISTORY: Social History   Tobacco Use   Smoking status: Never   Smokeless tobacco: Never  Vaping Use   Vaping status: Never Used  Substance Use Topics   Alcohol use: Never   Drug use: Never   Social History   Social History Narrative   Not on file     OBJECTIVE: PHYSICAL EXAM: There were no vitals taken for this visit.  General:*** General appearance: Awake and alert. No distress. Cooperative with exam.  Skin: No obvious rash or jaundice. HEENT: Atraumatic. Anicteric. Lungs: Non-labored breathing on room air  Heart: Regular Abdomen: Soft, non tender. Extremities: No edema. No obvious deformity.  Musculoskeletal: No obvious joint swelling. Psych: Affect appropriate.  Neurological: Mental Status: Alert. Speech fluent. No pseudobulbar affect Cranial  Nerves: CNII: No RAPD. Visual fields grossly intact. CNIII, IV, VI: PERRL. No nystagmus. EOMI. CN V: Facial sensation intact bilaterally to fine touch. [place] clench strong. Jaw jerk***. CN VII: Facial muscles symmetric and strong. No ptosis at rest or after sustained upgaze***. CN VIII: Hearing grossly intact bilaterally. CN IX: No hypophonia. CN X: Palate elevates symmetrically. CN XI: Full strength shoulder shrug bilaterally. CN XII: Tongue protrusion full and midline. No atrophy or fasciculations. No significant dysarthria*** Motor: Tone is ***. *** fasciculations in *** extremities. *** atrophy. No grip or percussive myotonia.***  Individual muscle group testing (MRC grade out of 5):  Movement     Neck flexion ***    Neck extension ***     Right Left   Shoulder abduction *** ***   Shoulder adduction *** ***   Shoulder ext rotation *** ***   Shoulder int rotation *** ***   Elbow flexion *** ***   Elbow extension *** ***   Wrist extension *** ***   Wrist flexion *** ***   Finger abduction - FDI *** ***   Finger abduction - ADM *** ***   Finger extension *** ***   Finger distal flexion - 2/3 *** ***   Finger distal flexion - 4/5 *** ***   Thumb flexion - FPL *** ***   Thumb abduction - APB *** ***    Hip flexion *** ***   Hip extension *** ***   Hip adduction *** ***   Hip abduction *** ***  Knee extension *** ***   Knee flexion *** ***   Dorsiflexion *** ***   Plantarflexion *** ***   Inversion *** ***   Eversion *** ***   Great toe extension *** ***   Great toe flexion *** ***     Reflexes:  Right Left   Bicep *** ***   Tricep *** ***   BrRad *** ***   Knee *** ***   Ankle *** ***    Pathological Reflexes: Babinski: *** response bilaterally*** Hoffman: *** Troemner: *** Pectoral: *** Palmomental: *** Facial: *** Midline tap: *** Sensation: Pinprick: *** Vibration: *** Temperature: *** Proprioception: *** Coordination: Intact finger-to-  nose-finger bilaterally. Romberg negative.*** Gait: Able to rise from chair with arms crossed unassisted. Normal, narrow-based gait. Able to tandem walk. Able to walk on toes and heels.***  Lab and Test Review: Internal labs: 02/01/23: CBC w/ diff significant for Hb 11.3 CMP significant for K 3.2, glucose 123  External labs: 01/08/24: HbA1c: 7.1  Imaging/Procedures: CT head wo contrast (01/15/24): FINDINGS: Brain: No acute infarct, hemorrhage, or mass lesion is present. A remote lacunar infarct is present in the posterior right lentiform nucleus. Mild periventricular and scattered subcortical white matter hypoattenuation is present bilaterally. An age indeterminate, likely acute/subacute nonhemorrhagic infarct is present in the left superior cerebellum. The brainstem and cerebellum are otherwise unremarkable.   Vascular: Atherosclerotic calcifications are present at the cavernous internal carotid arteries bilaterally. No hyperdense vessel is present.   Skull: Calvarium is intact. No focal lytic or blastic lesions are present.   Sinuses/Orbits: Bilateral lens replacements are noted. Globes and orbits are otherwise unremarkable. The paranasal sinuses and mastoid air cells are clear.   IMPRESSION: 1. Age indeterminate, likely acute/subacute nonhemorrhagic infarct in the left superior cerebellum. 2. Remote lacunar infarct of the posterior right lentiform nucleus. 3. Mild periventricular and scattered subcortical white matter hypoattenuation bilaterally is nonspecific, but likely reflects the sequela of chronic microvascular ischemia. ***  ASSESSMENT: Cassandra Bridges is a 73 y.o. female who presents for evaluation of ***. *** has a relevant medical history of ***. *** neurological examination is pertinent for ***. Available diagnostic data is significant for ***. This constellation of symptoms and objective data would most likely localize to ***. ***  PLAN: -Blood work:  *** ***  -Return to clinic ***  The impression above as well as the plan as outlined below were extensively discussed with the patient (in the company of ***) who voiced understanding. All questions were answered to their satisfaction.  The patient was counseled on pertinent fall precautions per the printed material provided today, and as noted under the "Patient Instructions" section below.***  When available, results of the above investigations and possible further recommendations will be communicated to the patient via telephone/MyChart. Patient to call office if not contacted after expected testing turnaround time.   Total time spent reviewing records, interview, history/exam, documentation, and coordination of care on day of encounter:  *** min   Thank you for allowing me to participate in patient's care.  If I can answer any additional questions, I would be pleased to do so.  Rommie Coats, MD   CC: Jearldine Mina, MD [address] [place] [address]  CC: Referring provider: Jearldine Mina, MD [address] [place],  [place] [address]

## 2024-02-12 NOTE — Telephone Encounter (Signed)
 CALLED PT. AND UNABLE TO LEAVE MESSAGE.

## 2024-02-14 NOTE — Telephone Encounter (Signed)
 This is a new pt she needs to do what her providers have told her to do until she sees Dr Genita Keys

## 2024-02-14 NOTE — Telephone Encounter (Signed)
Pt called no answer mail box is full  

## 2024-02-21 ENCOUNTER — Ambulatory Visit: Admitting: Neurology

## 2024-02-22 NOTE — Progress Notes (Signed)
 Initial neurology clinic note  Reason for Evaluation: Consultation requested by Husain, Karrar, MD for an opinion regarding imbalance. My final recommendations will be communicated back to the requesting physician by way of shared medical record or letter to requesting physician via [place]  mail.  HPI: This is Ms. Cassandra Bridges, a 73 y.o. right-handed female with a medical history of HTN, HLD, CHF, DM c/b neuropathy and retinopathy, PE, chronic pain, anxiety, depression, OA, avascular necrosis of left hip, asthma, vit D deficiency who presents to neurology clinic with the chief complaint of imbalance. The patient is accompanied by aunt (by marriage).  Patient has had poor balance for a long time, but it comes and goes. In the last couple of months, she noticed her balance being off more. She had a fall out of her truck (while sitting) for unclear reasons about 2 months ago. This has been her only recent fall.   She mentions difficulty lifting legs sometimes, right more than left. She has pain in bilateral hips from avascular necrosis. She has numbness and tingling in both feet which she attributes to neuropathy. She has pain in the bottom of her feet that can hurt more when she tries to put pressure on her feet. She also has numbness in her finger tips. She takes gabapentin 300 mg BID (no clear change in symptoms). She has been on this for 2 years. She does not think she has side effects from this.   She is to be taking Crestor  20 mg but is not taking it. She does not take glipizide and only occasionally takes Jardiance. She takes lasix . She does not like taking her medications though because she states they make her feel bad. She does not take aspirin.   She was taking Cymbalta for depression, but it did not help, so she stopped it.  Due to symptoms, patient's PCP ordered CT head. It showed age indeterminate, likely acute/subacute stroke in left superior cerebellum. There was also a remote  lacunar infarct in posterior lentiform nucleus on right. This was on 01/15/24. When referral was sent, I recommended patient be seen at ED due to concern for acute stroke. Patient did not know about this and did not go.   Of note, patient has a history of PE and had been on coumadin in the past. She also used to have IVC filter. She has no known history of abnormal heart rhythm such as atrial fibrillation.  She has no known history of OSA. She does not know if she snores. She does not feel well rested when waking. She feels tired throughout the day. She does not tend to sleep at night though. She may sleep from 8 am to 11 am. She finds it hard to sleep.  She mentions a remote history of migraines that stopped in her 31s. She has only headaches every now or then. She has noticed an increase of these as well. She now get 2-3 headaches per weeks. This has been over the last 3 months. They do not feel like her old migraines. She describes there being associated neck pain and stiffness associated with the headaches.  She has 50 lbs over 3 years gradually. She wasn't really trying to. She denies fevers or night sweats.  EtOH use: None Never smoker  Restrictive diet? No Family history of neurologic disease? Mother with brain tumors and history of blood clots    MEDICATIONS:  Outpatient Encounter Medications as of 03/05/2024  Medication Sig   albuterol (VENTOLIN  HFA) 108 (90 Base) MCG/ACT inhaler    aspirin EC 81 MG tablet Take 1 tablet (81 mg total) by mouth daily. Swallow whole.   Cholecalciferol (VITAMIN D3) 10 MCG (400 UNIT) tablet Take 400 Units by mouth daily.   furosemide  (LASIX ) 40 MG tablet TAKE 1 TABLET BY MOUTH  DAILY . MAY TAKE 1 EXTRA  TAB AS NEEDED FOR WEIGHT  GAIN OR SHORTNESS OF  BREATH. MAX 2 TABS PER DAY   JARDIANCE 10 MG TABS tablet Take 10 mg by mouth daily.   Multiple Vitamin (MULTIVITAMIN) tablet Take 1 tablet by mouth daily.   glipiZIDE (GLUCOTROL) 5 MG tablet Take 5 mg by  mouth 2 (two) times daily before a meal.  (Patient not taking: Reported on 03/05/2024)   ketoconazole (NIZORAL) 2 % cream Apply 1 application topically 2 (two) times daily. (Patient not taking: Reported on 03/05/2024)   montelukast (SINGULAIR) 10 MG tablet Take 10 mg by mouth daily. (Patient not taking: Reported on 03/05/2024)   rosuvastatin  (CRESTOR ) 40 MG tablet Take 1 tablet (40 mg total) by mouth daily. (Patient not taking: Reported on 03/05/2024)   traMADol (ULTRAM) 50 MG tablet Take 50 mg by mouth every 8 (eight) hours as needed for pain. (Patient not taking: Reported on 03/05/2024)   No facility-administered encounter medications on file as of 03/05/2024.    PAST MEDICAL HISTORY: Past Medical History:  Diagnosis Date   Arthritis    Diabetes mellitus without complication (HCC)    Hypertension    PE (pulmonary thromboembolism) (HCC) 2007    PAST SURGICAL HISTORY: Past Surgical History:  Procedure Laterality Date   OTHER SURGICAL HISTORY  2008   left hip replacement   PARTIAL HYSTERECTOMY     TOTAL HIP ARTHROPLASTY      ALLERGIES: Allergies  Allergen Reactions   Effexor [Venlafaxine] Other (See Comments)    Altered mental status   Penicillins Swelling    Has patient had a PCN reaction causing immediate rash, facial/tongue/throat swelling, SOB or lightheadedness with hypotension: Yes Has patient had a PCN reaction causing severe rash involving mucus membranes or skin necrosis: Unknown Has patient had a PCN reaction that required hospitalization: No Has patient had a PCN reaction occurring within the last 10 years: No If all of the above answers are NO, then may proceed with Cephalosporin use.\    FAMILY HISTORY: Family History  Problem Relation Age of Onset   Cancer Mother    Heart attack Father     SOCIAL HISTORY: Social History   Tobacco Use   Smoking status: Never   Smokeless tobacco: Never  Vaping Use   Vaping status: Never Used  Substance Use Topics   Alcohol  use: Never   Drug use: Never   Social History   Social History Narrative   Are you right handed or left handed? Right   Are you currently employed ?    What is your current occupation? retired   Do you live at home alone?   Who lives with you? yes   What type of home do you live in: 1 story or 2 story? one    Caffeine 1-2 coffees a week and 1-2 sodas a week     OBJECTIVE: PHYSICAL EXAM: BP 125/74   Pulse (!) 102   Ht 5' 3 (1.6 m)   Wt 175 lb (79.4 kg)   SpO2 97%   BMI 31.00 kg/m   General: General appearance: Awake and alert. No distress. Cooperative with exam.  Skin:  No obvious rash or jaundice. HEENT: Atraumatic. Anicteric. Tightness and tenderness of cervical paraspinal muscles and trapezius. Reduced range of motion in neck. Lungs: Non-labored breathing on room air  Heart: Regular. No carotid bruits. Extremities: Positive for bilateral lower extremity pitting edema to calves. Arthritic changes in hands and feet.  Psych: Affect appropriate.  Neurological: Mental Status: Alert. Speech fluent. No pseudobulbar affect Cranial Nerves: CNII: No RAPD. Visual fields grossly intact. CNIII, IV, VI: PERRL. No nystagmus. EOMI. CN V: Facial sensation intact bilaterally to fine touch. CN VII: Facial muscles symmetric and strong. No ptosis at rest CN VIII: Hearing grossly intact bilaterally. CN IX: No hypophonia. CN X: Palate elevates symmetrically. CN XI: Full strength shoulder shrug bilaterally. CN XII: Tongue protrusion full and midline. No atrophy or fasciculations. No significant dysarthria Motor: Tone is normal. Strength is 5/5 in bilateral upper and lower extremities. Reflexes:  Right Left   Bicep 2+ 2+   Tricep 2+ 2+   BrRad 2+ 2+   Knee 0 0   Ankle 0 0    Pathological Reflexes: Babinski: mute response bilaterally Hoffman: absent bilaterally Troemner: absent bilaterally Sensation: Pinprick: Absent in lower extremities to the knees bilaterally Vibration:  Absent in lower extremities to the knees bilaterally Proprioception: Diminished in bilateral great toes Coordination: Intact finger-to- nose-finger bilaterally. Romberg with mild sway. No dysdiadochokinesia. Gait: Able to rise from chair with arms crossed unassisted. Wide-based gait. No unsteadiness, no freezing, normal turns  Lab and Test Review: Internal labs: 02/01/23: CBC w/ diff significant for Hb 11.3 CMP significant for K 3.2, glucose 123  External labs: 01/08/24: HbA1c: 7.1  Imaging/Procedures: CT head wo contrast (01/15/24): FINDINGS: Brain: No acute infarct, hemorrhage, or mass lesion is present. A remote lacunar infarct is present in the posterior right lentiform nucleus. Mild periventricular and scattered subcortical white matter hypoattenuation is present bilaterally. An age indeterminate, likely acute/subacute nonhemorrhagic infarct is present in the left superior cerebellum. The brainstem and cerebellum are otherwise unremarkable.   Vascular: Atherosclerotic calcifications are present at the cavernous internal carotid arteries bilaterally. No hyperdense vessel is present.   Skull: Calvarium is intact. No focal lytic or blastic lesions are present.   Sinuses/Orbits: Bilateral lens replacements are noted. Globes and orbits are otherwise unremarkable. The paranasal sinuses and mastoid air cells are clear.   IMPRESSION: 1. Age indeterminate, likely acute/subacute nonhemorrhagic infarct in the left superior cerebellum. 2. Remote lacunar infarct of the posterior right lentiform nucleus. 3. Mild periventricular and scattered subcortical white matter hypoattenuation bilaterally is nonspecific, but likely reflects the sequela of chronic microvascular ischemia.  ASSESSMENT: Cassandra Bridges is a 73 y.o. female who presents for evaluation of imbalance. She has a relevant medical history of HTN, HLD, CHF, DM c/b neuropathy and retinopathy, PE, chronic pain, anxiety,  depression, OA, avascular necrosis of left hip, asthma, vit D deficiency. Her neurological examination is pertinent for bilateral lower extremity sensory loss below the knees. Available diagnostic data is significant for HbA1c of 7.1. CTH from 01/15/24 showed age indeterminate infarct in left superior cerebellum, remote lacunar infarct in the posterior right lentiform nucleus, and chronic microvascular ischemia. 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 This is likely the etiology of her imbalance. While she has no clear cerebellar signs on examination, a cerebellar stroke could also be contributing. Patient has many risk factors for stroke, including HTN, HLD, DM, CHF, and possibly untreated OSA. She endorses not taking many of her medications for  years but not telling her doctors. We discussed the importance of taking medications and talking to her doctors if she does not like any of her medications as there can be alternatives. I will work up both neuropathy and stroke as below. Of note, she also mentioned headaches today which sound most consistent with cervicogenic headaches. This can be addressed at future appointments.  PLAN: -Blood work: TSH, Lipid panel, CBC, CMP, B1, B12, folate, IFE, vit D -Could consider hypercoag work up if stroke work up appear embolic without other etiology -MRI brain wo contrast -CTA head and neck -Echocardiogram -Start aspirin 81 mg daily -Restart Crestor  20 mg daily. Speak with PCP if she does not like this medication -Sleep medicine referral (insomnia and possible OSA) -Discussed importance of taking medications and discussing medications she does not like with PCP -Stroke warning signs discussed -May consider PT for cervicalgia if headaches persist -Continue gabapentin 300 mg BID for now  -Return to clinic in 3 months  The impression above as well as the plan as outlined below were  extensively discussed with the patient (in the company of aunt) who voiced understanding. All questions were answered to their satisfaction.  The patient was counseled on pertinent fall precautions per the printed material provided today, and as noted under the Patient Instructions section below.  When available, results of the above investigations and possible further recommendations will be communicated to the patient via telephone/MyChart. Patient to call office if not contacted after expected testing turnaround time.   Total time spent reviewing records, interview, history/exam, documentation, and coordination of care on day of encounter:  75 min   Thank you for allowing me to participate in patient's care.  If I can answer any additional questions, I would be pleased to do so.  Venetia Potters, MD   CC: Ransom Other, MD [address] [place] [address]  CC: Referring provider: Ransom Other, MD [address] [place],  [place] [address]

## 2024-03-05 ENCOUNTER — Encounter: Payer: Self-pay | Admitting: Neurology

## 2024-03-05 ENCOUNTER — Ambulatory Visit: Admitting: Neurology

## 2024-03-05 ENCOUNTER — Other Ambulatory Visit

## 2024-03-05 VITALS — BP 125/74 | HR 102 | Ht 63.0 in | Wt 175.0 lb

## 2024-03-05 DIAGNOSIS — E1142 Type 2 diabetes mellitus with diabetic polyneuropathy: Secondary | ICD-10-CM

## 2024-03-05 DIAGNOSIS — I639 Cerebral infarction, unspecified: Secondary | ICD-10-CM | POA: Diagnosis not present

## 2024-03-05 DIAGNOSIS — G629 Polyneuropathy, unspecified: Secondary | ICD-10-CM

## 2024-03-05 DIAGNOSIS — Z86718 Personal history of other venous thrombosis and embolism: Secondary | ICD-10-CM | POA: Diagnosis not present

## 2024-03-05 DIAGNOSIS — Z72820 Sleep deprivation: Secondary | ICD-10-CM

## 2024-03-05 DIAGNOSIS — R2689 Other abnormalities of gait and mobility: Secondary | ICD-10-CM | POA: Diagnosis not present

## 2024-03-05 DIAGNOSIS — M542 Cervicalgia: Secondary | ICD-10-CM

## 2024-03-05 DIAGNOSIS — G4700 Insomnia, unspecified: Secondary | ICD-10-CM | POA: Diagnosis not present

## 2024-03-05 DIAGNOSIS — E785 Hyperlipidemia, unspecified: Secondary | ICD-10-CM

## 2024-03-05 DIAGNOSIS — G4486 Cervicogenic headache: Secondary | ICD-10-CM

## 2024-03-05 MED ORDER — ASPIRIN 81 MG PO TBEC
81.0000 mg | DELAYED_RELEASE_TABLET | Freq: Every day | ORAL | 12 refills | Status: AC
Start: 2024-03-05 — End: ?

## 2024-03-05 NOTE — Patient Instructions (Signed)
 I saw you today for imbalance. Your examination supports the diagnosis of neuropathy, which can certainly cause imbalance.  Your CT scan seemed to show a stroke in the back part of your brain that could also contribute to imbalance.  I will get more blood work today.  I will also get an MRI brain and a CT of your blood vessels to better clarify the question of stroke.  I will also get an echocardiogram of your heart to make sure it is pumping properly.  I want you to start aspirin 81 mg daily.  Restart Crestor  20 mg daily.  You should take all your medications or talk to your primary care if you don't like your medications.  If you have new difficulty speaking, face droop, numbness on one side of the body, weakness on one side of the body, or dizziness/imbalance, this could be the sign of a stroke. Don't wait, please call EMS and be evaluated at the nearest emergency room.   The physicians and staff at [facility] are committed to providing excellent care. You may receive a survey requesting feedback about your experience at our office. We strive to receive very good responses to the survey questions. If you feel that your experience would prevent you from giving the office a very good  response, please contact our office to try to remedy the situation. We may be reached at [phone]. Thank you for taking the time out of your busy day to complete the survey.  Cassandra Potters, MD [facility]  Preventing Falls at [facility] are common, often dreaded events in the lives of older people. Aside from the obvious injuries and even death that may result, fall can cause wide-ranging consequences including loss of independence, mental decline, decreased activity and mobility. Younger people are also at risk of falling, especially those with chronic illnesses and fatigue.  Ways to reduce risk for falling Examine diet and medications. Warm foods and alcohol dilate blood vessels, which  can lead to dizziness when standing. Sleep aids, antidepressants and pain medications can also increase the likelihood of a fall.  Get a vision exam. Poor vision, cataracts and glaucoma increase the chances of falling.  Check foot gear. Shoes should fit snugly and have a sturdy, nonskid sole and a broad, low heel  Participate in a physician-approved exercise program to build and maintain muscle strength and improve balance and coordination. Programs that use ankle weights or stretch bands are excellent for muscle-strengthening. Water aerobics programs and low-impact Tai Chi programs have also been shown to improve balance and coordination.  Increase vitamin D intake. Vitamin D improves muscle strength and increases the amount of calcium  the body is able to absorb and deposit in bones.  How to prevent falls from common hazards Floors - Remove all loose wires, cords, and throw rugs. Minimize clutter. Make sure rugs are anchored and smooth. Keep furniture in its usual place.  Chairs -- Use chairs with straight backs, armrests and firm seats. Add firm cushions to existing pieces to add height.  Bathroom - Install grab bars and non-skid tape in the tub or shower. Use a bathtub transfer bench or a shower chair with a back support Use an elevated toilet seat and/or safety rails to assist standing from a low surface. Do not use towel racks or bathroom tissue holders to help you stand.  Lighting - Make sure halls, stairways, and entrances are well-lit. Install a night light in your bathroom or hallway. Make sure there  is a light switch at the top and bottom of the staircase. Turn lights on if you get up in the middle of the night. Make sure lamps or light switches are within reach of the bed if you have to get up during the night.  Kitchen - Install non-skid rubber mats near the sink and stove. Clean spills immediately. Store frequently used utensils, pots, pans between waist and eye level. This helps  prevent reaching and bending. Sit when getting things out of lower cupboards.  Living room/ Bedrooms - Place furniture with wide spaces in between, giving enough room to move around. Establish a route through the living room that gives you something to hold onto as you walk.  Stairs - Make sure treads, rails, and rugs are secure. Install a rail on both sides of the stairs. If stairs are a threat, it might be helpful to arrange most of your activities on the lower level to reduce the number of times you must climb the stairs.  Entrances and doorways - Install metal handles on the walls adjacent to the doorknobs of all doors to make it more secure as you travel through the doorway.  Tips for maintaining balance Keep at least one hand free at all times. Try using a backpack or fanny pack to hold things rather than carrying them in your hands. Never carry objects in both hands when walking as this interferes with keeping your balance.  Attempt to swing both arms from front to back while walking. This might require a conscious effort if Parkinson's disease has diminished your movement. It will, however, help you to maintain balance and posture, and reduce fatigue.  Consciously lift your feet off of the ground when walking. Shuffling and dragging of the feet is a common culprit in losing your balance.  When trying to navigate turns, use a U technique of facing forward and making a wide turn, rather than pivoting sharply.  Try to stand with your feet shoulder-length apart. When your feet are close together for any length of time, you increase your risk of losing your balance and falling.  Do one thing at a time. Don't try to walk and accomplish another task, such as reading or looking around. The decrease in your automatic reflexes complicates motor function, so the less distraction, the better.  Do not wear rubber or gripping soled shoes, they might catch on the floor and cause tripping.  Move  slowly when changing positions. Use deliberate, concentrated movements and, if needed, use a grab bar or walking aid. Count 15 seconds between each movement. For example, when rising from a seated position, wait 15 seconds after standing to begin walking.  If balance is a continuous problem, you might want to consider a walking aid such as a cane, walking stick, or walker. Once you've mastered walking with help, you might be ready to try it on your own again.

## 2024-03-12 NOTE — Progress Notes (Signed)
 [employer] provider portal NO PA required for CPT 93306 [id number].

## 2024-03-13 ENCOUNTER — Ambulatory Visit: Payer: Self-pay | Admitting: Neurology

## 2024-03-13 LAB — VITAMIN B1: Vitamin B1 (Thiamine): 11 nmol/L (ref 8–30)

## 2024-03-13 LAB — LIPID PANEL
Cholesterol: 166 mg/dL (ref <200)
HDL: 61 mg/dL (ref >=50)
LDL Cholesterol (Calc): 85 mg/dL
Non-HDL Cholesterol (Calc): 105 mg/dL (ref <130)
Total CHOL/HDL Ratio: 2.7 (calc) (ref <5.0)
Triglycerides: 103 mg/dL (ref <150)

## 2024-03-13 LAB — COMPREHENSIVE METABOLIC PANEL WITH GFR
AG Ratio: 1.3 (calc) (ref 1.0–2.5)
ALT: 15 U/L (ref 6–29)
AST: 15 U/L (ref 10–35)
Albumin: 4.1 g/dL (ref 3.6–5.1)
Alkaline phosphatase (APISO): 92 U/L (ref 37–153)
BUN: 15 mg/dL (ref 7–25)
CO2: 30 mmol/L (ref 20–32)
Calcium: 9.2 mg/dL (ref 8.6–10.4)
Chloride: 102 mmol/L (ref 98–110)
Creat: 0.72 mg/dL (ref 0.60–1.00)
Globulin: 3.1 g/dL (ref 1.9–3.7)
Glucose, Bld: 121 mg/dL — ABNORMAL HIGH (ref 65–99)
Potassium: 4.2 mmol/L (ref 3.5–5.3)
Sodium: 140 mmol/L (ref 135–146)
Total Bilirubin: 0.6 mg/dL (ref 0.2–1.2)
Total Protein: 7.2 g/dL (ref 6.1–8.1)
eGFR: 88 mL/min/1.73m2 (ref >=60)

## 2024-03-13 LAB — CBC
HCT: 37.2 % (ref 35.0–45.0)
Hemoglobin: 11.9 g/dL (ref 11.7–15.5)
MCH: 27.1 pg (ref 27.0–33.0)
MCHC: 32 g/dL (ref 32.0–36.0)
MCV: 84.7 fL (ref 80.0–100.0)
MPV: 11.2 fL (ref 7.5–12.5)
Platelets: 346 Thousand/uL (ref 140–400)
RBC: 4.39 Million/uL (ref 3.80–5.10)
RDW: 13.9 % (ref 11.0–15.0)
WBC: 9.1 Thousand/uL (ref 3.8–10.8)

## 2024-03-13 LAB — VITAMIN D 25 HYDROXY (VIT D DEFICIENCY, FRACTURES): Vit D, 25-Hydroxy: 37 ng/mL (ref 30–100)

## 2024-03-13 LAB — B12 AND FOLATE PANEL
Folate: 13.6 ng/mL
Vitamin B-12: 461 pg/mL (ref 200–1100)

## 2024-03-13 LAB — IMMUNOFIXATION ELECTROPHORESIS
IgM, Serum: 49 mg/dL — ABNORMAL LOW (ref 50–300)
IgM, Serum: 1465 mg/dL — AB (ref 600–300)
Immunoglobulin A: 1465 mg/dL — AB (ref 70–320)
Immunoglobulin A: 364 mg/dL — AB (ref 70–320)

## 2024-03-13 LAB — TSH: TSH: 2.15 m[IU]/L (ref 0.40–4.50)

## 2024-03-18 ENCOUNTER — Ambulatory Visit
Admission: RE | Admit: 2024-03-18 | Discharge: 2024-03-18 | Disposition: A | Discharge status: Home / Self Care | Source: Ambulatory Visit | Attending: Neurology | Admitting: Neurology

## 2024-03-18 ENCOUNTER — Ambulatory Visit
Admission: RE | Admit: 2024-03-18 | Discharge: 2024-03-18 | Disposition: A | Discharge status: Home / Self Care | Source: Ambulatory Visit | Attending: Neurology

## 2024-03-18 ENCOUNTER — Other Ambulatory Visit: Payer: Self-pay | Admitting: Neurology

## 2024-03-18 DIAGNOSIS — I639 Cerebral infarction, unspecified: Secondary | ICD-10-CM

## 2024-03-18 DIAGNOSIS — G629 Polyneuropathy, unspecified: Secondary | ICD-10-CM

## 2024-03-18 DIAGNOSIS — Z86718 Personal history of other venous thrombosis and embolism: Secondary | ICD-10-CM

## 2024-03-18 DIAGNOSIS — G4486 Cervicogenic headache: Secondary | ICD-10-CM

## 2024-03-18 DIAGNOSIS — R2689 Other abnormalities of gait and mobility: Secondary | ICD-10-CM

## 2024-03-18 DIAGNOSIS — E785 Hyperlipidemia, unspecified: Secondary | ICD-10-CM

## 2024-03-18 DIAGNOSIS — E1142 Type 2 diabetes mellitus with diabetic polyneuropathy: Secondary | ICD-10-CM | POA: Diagnosis not present

## 2024-03-18 DIAGNOSIS — Z72820 Sleep deprivation: Secondary | ICD-10-CM

## 2024-03-18 DIAGNOSIS — G4700 Insomnia, unspecified: Secondary | ICD-10-CM

## 2024-03-18 DIAGNOSIS — M542 Cervicalgia: Secondary | ICD-10-CM

## 2024-03-18 MED ORDER — IOPAMIDOL (ISOVUE-370) INJECTION 76%
75.0000 mL | Freq: Once | INTRAVENOUS | Status: AC | PRN
  Administered 2024-03-18: 75 mL via INTRAVENOUS

## 2024-03-24 NOTE — Telephone Encounter (Signed)
 Pt is calling renee back to get test results Best call back number [phone]

## 2024-03-27 ENCOUNTER — Other Ambulatory Visit: Payer: Self-pay

## 2024-03-28 ENCOUNTER — Ambulatory Visit (HOSPITAL_COMMUNITY)
Admission: RE | Admit: 2024-03-28 | Discharge: 2024-03-28 | Disposition: A | Discharge status: Home / Self Care | Source: Ambulatory Visit | Attending: Vascular Surgery | Admitting: Vascular Surgery

## 2024-03-28 ENCOUNTER — Other Ambulatory Visit: Payer: Self-pay

## 2024-03-28 DIAGNOSIS — Z72820 Sleep deprivation: Secondary | ICD-10-CM | POA: Diagnosis present

## 2024-03-28 DIAGNOSIS — I10 Essential (primary) hypertension: Secondary | ICD-10-CM | POA: Insufficient documentation

## 2024-03-28 DIAGNOSIS — I340 Nonrheumatic mitral (valve) insufficiency: Secondary | ICD-10-CM | POA: Diagnosis not present

## 2024-03-28 DIAGNOSIS — G629 Polyneuropathy, unspecified: Secondary | ICD-10-CM | POA: Diagnosis not present

## 2024-03-28 DIAGNOSIS — G4700 Insomnia, unspecified: Secondary | ICD-10-CM | POA: Insufficient documentation

## 2024-03-28 DIAGNOSIS — Z86718 Personal history of other venous thrombosis and embolism: Secondary | ICD-10-CM | POA: Diagnosis not present

## 2024-03-28 DIAGNOSIS — E1142 Type 2 diabetes mellitus with diabetic polyneuropathy: Secondary | ICD-10-CM | POA: Diagnosis not present

## 2024-03-28 DIAGNOSIS — I349 Nonrheumatic mitral valve disorder, unspecified: Secondary | ICD-10-CM | POA: Diagnosis not present

## 2024-03-28 DIAGNOSIS — E785 Hyperlipidemia, unspecified: Secondary | ICD-10-CM | POA: Diagnosis not present

## 2024-03-28 DIAGNOSIS — Z7409 Other reduced mobility: Secondary | ICD-10-CM | POA: Insufficient documentation

## 2024-03-28 DIAGNOSIS — I639 Cerebral infarction, unspecified: Secondary | ICD-10-CM | POA: Diagnosis not present

## 2024-03-28 DIAGNOSIS — R2689 Other abnormalities of gait and mobility: Secondary | ICD-10-CM | POA: Insufficient documentation

## 2024-03-28 LAB — ECHOCARDIOGRAM COMPLETE
AR max vel: 1.23 cm2
AV Area VTI: 1.19 cm2
AV Area mean vel: 1.22 cm2
AV Mean grad: 6 mmHg
AV Peak grad: 11.1 mmHg
Ao pk vel: 1.66 m/s
Area-P 1/2: 4.31 cm2
MV M vel: 3.58 m/s
MV Peak grad: 51.3 mmHg
P 1/2 time: 88.2 ms
S' Lateral: 2.63 cm

## 2024-03-31 ENCOUNTER — Other Ambulatory Visit: Payer: Self-pay

## 2024-03-31 DIAGNOSIS — I771 Stricture of artery: Secondary | ICD-10-CM

## 2024-04-25 ENCOUNTER — Other Ambulatory Visit: Payer: Self-pay

## 2024-04-25 DIAGNOSIS — I771 Stricture of artery: Secondary | ICD-10-CM

## 2024-05-20 NOTE — Progress Notes (Unsigned)
 Office Note     CC: Incidental finding of left subclavian artery stenosis Requesting Provider:  Leigh Venetia CROME, MD  HPI: Cassandra Bridges is a 73 y.o. (1951/06/03) female presenting at the request of .Husain, Karrar, MD for incidental finding of proximal left subclavian artery stenosis.  Patient has a history of left cerebellar infarct appreciated on May head CT with balance difficulties.  The pt is *** on a statin for cholesterol management.  The pt is *** on a daily aspirin .   Other AC:  *** The pt is *** on medication for hypertension.   The pt is *** diabetic.  Tobacco hx:  ***  Past Medical History:  Diagnosis Date   Arthritis    Diabetes mellitus without complication (HCC)    Hypertension    PE (pulmonary thromboembolism) (HCC) 2007    Past Surgical History:  Procedure Laterality Date   OTHER SURGICAL HISTORY  2008   left hip replacement   PARTIAL HYSTERECTOMY     TOTAL HIP ARTHROPLASTY      Social History   Socioeconomic History   Marital status: Divorced    Spouse name: Not on file   Number of children: 2   Years of education: Not on file   Highest education level: Not on file  Occupational History   Occupation: retired  Tobacco Use   Smoking status: Never   Smokeless tobacco: Never  Vaping Use   Vaping status: Never Used  Substance and Sexual Activity   Alcohol use: Never   Drug use: Never   Sexual activity: Not on file  Other Topics Concern   Not on file  Social History Narrative   Are you right handed or left handed? Right   Are you currently employed ?    What is your current occupation? retired   Do you live at home alone?   Who lives with you? yes   What type of home do you live in: 1 story or 2 story? one    Caffeine 1-2 coffees a week and 1-2 sodas a week   Social Drivers of Corporate investment banker Strain: Not on file  Food Insecurity: Not on file  Transportation Needs: Not on file  Physical Activity: Not on file  Stress: Not  on file  Social Connections: Not on file  Intimate Partner Violence: Not on file   *** Family History  Problem Relation Age of Onset   Cancer Mother    Heart attack Father     Current Outpatient Medications  Medication Sig Dispense Refill   albuterol (VENTOLIN HFA) 108 (90 Base) MCG/ACT inhaler      aspirin  EC 81 MG tablet Take 1 tablet (81 mg total) by mouth daily. Swallow whole. 30 tablet 12   Cholecalciferol (VITAMIN D3) 10 MCG (400 UNIT) tablet Take 400 Units by mouth daily.     furosemide  (LASIX ) 40 MG tablet TAKE 1 TABLET BY MOUTH  DAILY . MAY TAKE 1 EXTRA  TAB AS NEEDED FOR WEIGHT  GAIN OR SHORTNESS OF  BREATH. MAX 2 TABS PER DAY 180 tablet 3   glipiZIDE (GLUCOTROL) 5 MG tablet Take 5 mg by mouth 2 (two) times daily before a meal.  (Patient not taking: Reported on 03/05/2024)     JARDIANCE 10 MG TABS tablet Take 10 mg by mouth daily.     ketoconazole (NIZORAL) 2 % cream Apply 1 application topically 2 (two) times daily. (Patient not taking: Reported on 03/05/2024)     montelukast (  SINGULAIR) 10 MG tablet Take 10 mg by mouth daily. (Patient not taking: Reported on 03/05/2024)     Multiple Vitamin (MULTIVITAMIN) tablet Take 1 tablet by mouth daily.     rosuvastatin  (CRESTOR ) 40 MG tablet Take 1 tablet (40 mg total) by mouth daily. (Patient not taking: Reported on 03/05/2024) 90 tablet 1   traMADol (ULTRAM) 50 MG tablet Take 50 mg by mouth every 8 (eight) hours as needed for pain. (Patient not taking: Reported on 03/05/2024)     No current facility-administered medications for this visit.    Allergies  Allergen Reactions   Effexor [Venlafaxine] Other (See Comments)    Altered mental status   Penicillins Swelling    Has patient had a PCN reaction causing immediate rash, facial/tongue/throat swelling, SOB or lightheadedness with hypotension: Yes Has patient had a PCN reaction causing severe rash involving mucus membranes or skin necrosis: Unknown Has patient had a PCN reaction that  required hospitalization: No Has patient had a PCN reaction occurring within the last 10 years: No If all of the above answers are NO, then may proceed with Cephalosporin use.\     REVIEW OF SYSTEMS:  *** [X]  denotes positive finding, [ ]  denotes negative finding Cardiac  Comments:  Chest pain or chest pressure:    Shortness of breath upon exertion:    Short of breath when lying flat:    Irregular heart rhythm:        Vascular    Pain in calf, thigh, or hip brought on by ambulation:    Pain in feet at night that wakes you up from your sleep:     Blood clot in your veins:    Leg swelling:         Pulmonary    Oxygen at home:    Productive cough:     Wheezing:         Neurologic    Sudden weakness in arms or legs:     Sudden numbness in arms or legs:     Sudden onset of difficulty speaking or slurred speech:    Temporary loss of vision in one eye:     Problems with dizziness:         Gastrointestinal    Blood in stool:     Vomited blood:         Genitourinary    Burning when urinating:     Blood in urine:        Psychiatric    Major depression:         Hematologic    Bleeding problems:    Problems with blood clotting too easily:        Skin    Rashes or ulcers:        Constitutional    Fever or chills:      PHYSICAL EXAMINATION:  There were no vitals filed for this visit.  General:  WDWN in NAD; vital signs documented above Gait: Not observed HENT: WNL, normocephalic Pulmonary: normal non-labored breathing , without wheezing Cardiac: {Desc; regular/irreg:14544} HR Abdomen: soft, NT, no masses Skin: {With/Without:20273} rashes Vascular Exam/Pulses:  Right Left  Radial {Exam; arterial pulse strength 0-4:30167} {Exam; arterial pulse strength 0-4:30167}  Ulnar {Exam; arterial pulse strength 0-4:30167} {Exam; arterial pulse strength 0-4:30167}  Femoral {Exam; arterial pulse strength 0-4:30167} {Exam; arterial pulse strength 0-4:30167}  Popliteal  {Exam; arterial pulse strength 0-4:30167} {Exam; arterial pulse strength 0-4:30167}  DP {Exam; arterial pulse strength 0-4:30167} {Exam; arterial pulse strength 0-4:30167}  PT {  Exam; arterial pulse strength 0-4:30167} {Exam; arterial pulse strength 0-4:30167}   Extremities: {With/Without:20273} ischemic changes, {With/Without:20273} Gangrene , {With/Without:20273} cellulitis; {With/Without:20273} open wounds;  Musculoskeletal: no muscle wasting or atrophy  Neurologic: A&O X 3;  No focal weakness or paresthesias are detected Psychiatric:  The pt has {Desc; normal/abnormal:11317::Normal} affect.   Non-Invasive Vascular Imaging:   ***    ASSESSMENT/PLAN: Khadija Niobe Dick is a 73 y.o. female presenting with ***   ***   Fonda FORBES Rim, MD [facility] [phone]

## 2024-05-22 ENCOUNTER — Ambulatory Visit (INDEPENDENT_AMBULATORY_CARE_PROVIDER_SITE_OTHER): Admitting: Vascular Surgery

## 2024-05-22 ENCOUNTER — Ambulatory Visit (HOSPITAL_COMMUNITY)
Admission: RE | Admit: 2024-05-22 | Discharge: 2024-05-22 | Disposition: A | Discharge status: Home / Self Care | Source: Ambulatory Visit | Attending: Vascular Surgery | Admitting: Vascular Surgery

## 2024-05-22 ENCOUNTER — Encounter: Payer: Self-pay | Admitting: Vascular Surgery

## 2024-05-22 VITALS — BP 129/85 | HR 90 | Temp 98.0°F | Resp 20 | Ht 63.0 in | Wt 169.4 lb

## 2024-05-22 DIAGNOSIS — I771 Stricture of artery: Secondary | ICD-10-CM

## 2024-06-02 ENCOUNTER — Ambulatory Visit (INDEPENDENT_AMBULATORY_CARE_PROVIDER_SITE_OTHER): Admitting: Adult Health

## 2024-06-02 ENCOUNTER — Encounter: Payer: Self-pay | Admitting: Adult Health

## 2024-06-02 VITALS — BP 151/77 | HR 82 | Ht 62.0 in | Wt 173.2 lb

## 2024-06-02 DIAGNOSIS — G4733 Obstructive sleep apnea (adult) (pediatric): Secondary | ICD-10-CM

## 2024-06-02 DIAGNOSIS — G4709 Other insomnia: Secondary | ICD-10-CM | POA: Diagnosis not present

## 2024-06-02 DIAGNOSIS — G4719 Other hypersomnia: Secondary | ICD-10-CM | POA: Diagnosis not present

## 2024-06-02 DIAGNOSIS — Z6831 Body mass index (BMI) 31.0-31.9, adult: Secondary | ICD-10-CM

## 2024-06-02 NOTE — Progress Notes (Signed)
 @Patient  ID: Cassandra Bridges Search, female    DOB: 1950-10-22, 73 y.o.   MRN: [id number]  No chief complaint on file.   Referring provider: Leigh Venetia CROME, MD  HPI: 73 yo female seen for sleep consult 06/02/24 for daytime sleepiness    TEST/EVENTS :  Discussed the use of AI scribe software for clinical note transcription with the patient, who gave verbal consent to proceed.  History of Present Illness Cassandra Bridges is a 73 year old femalee who presents with chronic insomnia and daytime sleepiness. She was referred by Dr. Leigh makua, for evaluation of her sleep issues.  She has experienced lifelong difficulty with sleep, describing herself as someone who 'comes alive when it gets dark.' She typically does not go to bed until 6 to 8 AM, sleeping only until 10 or 11 AM, averaging 2 to 4 hours of sleep per night. Significant daytime sleepiness is present, often feeling drowsy during conversations. No napping during the day. No current use of sleep aids or sleeping pills. Has tried to change sleep times recently to going to bed by midnight and getting up at 8 am. Sleeps for a couple of hours at a time. Minimal caffeine intake.   She has a history of sleep apnea diagnosed in the early 2000s, initially identified during a sleep study.  She was prescribed a CPAP machine but discontinued its use due to recurrent sinus infections.  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 This pain contributes to her inability to remain in bed for extended periods, as she must frequently change positions due to discomfort.  Her past medical history includes a stroke, with residual balance issues. She is currently under the care of Dr. Leigh.   She has a history of pulmonary embolism, diabetes, and high cholesterol. She denies current use of diabetes medication, s She occasionally takes gabapentin (Neurontin) but not consistently due to  adverse effects.  No smoking, alcohol, or drug use. 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 She consumes caffeine infrequently, with coffee or Pepsi about three times a week.  No symptoms suspicious for cataplexy or sleep paralysis. Has upper and lower dentures.       Allergies  Allergen Reactions   Effexor [Venlafaxine] Other (See Comments)    Altered mental status   Penicillins Swelling    Has patient had a PCN reaction causing immediate rash, facial/tongue/throat swelling, SOB or lightheadedness with hypotension: Yes Has patient had a PCN reaction causing severe rash involving mucus membranes or skin necrosis: Unknown Has patient had a PCN reaction that required hospitalization: No Has patient had a PCN reaction occurring within the last 10 years: No If all of the above answers are NO, then may proceed with Cephalosporin use.\    Immunization History  Administered Date(s) Administered   Tdap 02/02/2023    Past Medical History:  Diagnosis Date   Arthritis    Diabetes mellitus without complication (HCC)    Hypertension    PE (pulmonary thromboembolism) (HCC) 2007    Tobacco History: Social History   Tobacco Use  Smoking Status Never  Smokeless Tobacco Never   Counseling given: Not Answered   Outpatient Medications Prior to Visit  Medication Sig Dispense Refill   albuterol (VENTOLIN HFA) 108 (90 Base) MCG/ACT inhaler      aspirin  EC 81 MG tablet Take 1 tablet (81 mg total) by mouth daily. Swallow  whole. 30 tablet 12   Cholecalciferol (VITAMIN D3) 10 MCG (400 UNIT) tablet Take 400 Units by mouth daily.     furosemide  (LASIX ) 40 MG tablet TAKE 1 TABLET BY MOUTH  DAILY . MAY TAKE 1 EXTRA  TAB AS NEEDED FOR WEIGHT  GAIN OR SHORTNESS OF  BREATH. MAX 2 TABS PER DAY 180 tablet 3   JARDIANCE 10 MG TABS tablet Take 10 mg by mouth daily.     Multiple Vitamin (MULTIVITAMIN) tablet Take 1 tablet by mouth daily.     No  facility-administered medications prior to visit.     Review of Systems:   Constitutional:   No  weight loss, night sweats,  Fevers, chills,+ fatigue, or  lassitude.  HEENT:   No headaches,  Difficulty swallowing,  Tooth/dental problems, or  Sore throat,                No sneezing, itching, ear ache, nasal congestion, post nasal drip,   CV:  No chest pain,  Orthopnea, PND, swelling in lower extremities, anasarca, dizziness, palpitations, syncope.   GI  No heartburn, indigestion, abdominal pain, nausea, vomiting, diarrhea, change in bowel habits, loss of appetite, bloody stools.   Resp: No shortness of breath with exertion or at rest.  No excess mucus, no productive cough,  No non-productive cough,  No coughing up of blood.  No change in color of mucus.  No wheezing.  No chest wall deformity  Skin: no rash or lesions.  GU: no dysuria, change in color of urine, no urgency or frequency.  No flank pain, no hematuria   MS:  No joint pain or swelling.  No decreased range of motion.  No back pain.    Physical Exam   GEN: A/Ox3; pleasant , NAD, well nourished    HEENT:  [place]/AT,    NOSE-clear, THROAT-clear, no lesions, no postnasal drip or exudate noted. Class 2-3 MP airway . Edentulous   NECK:  Supple w/ fair ROM; no JVD; normal carotid impulses w/o bruits; no thyromegaly or nodules palpated; no lymphadenopathy.    RESP  Clear  P & A; w/o, wheezes/ rales/ or rhonchi. no accessory muscle use, no dullness to percussion  CARD:  RRR, no m/r/g, no peripheral edema, pulses intact, no cyanosis or clubbing.  GI:   Soft & nt; nml bowel sounds; no organomegaly or masses detected.   Musco: Warm bil, no deformities or joint swelling noted.   Neuro: alert, no focal deficits noted.    Skin: Warm, no lesions or rashes    Lab Results:  CBC    Component Value Date/Time   WBC 9.1 03/05/2024 1234   RBC 4.39 03/05/2024 1234   HGB 11.9 03/05/2024 1234   HCT 37.2 03/05/2024 1234   PLT 346  03/05/2024 1234   MCV 84.7 03/05/2024 1234   MCH 27.1 03/05/2024 1234   MCHC 32.0 03/05/2024 1234   RDW 13.9 03/05/2024 1234   LYMPHSABS 3.2 02/01/2023 2212   MONOABS 0.7 02/01/2023 2212   EOSABS 0.3 02/01/2023 2212   BASOSABS 0.0 02/01/2023 2212    BMET    Component Value Date/Time   NA 140 03/05/2024 1234   K 4.2 03/05/2024 1234   CL 102 03/05/2024 1234   CO2 30 03/05/2024 1234   GLUCOSE 121 (H) 03/05/2024 1234   BUN 15 03/05/2024 1234   CREATININE 0.72 03/05/2024 1234   CALCIUM  9.2 03/05/2024 1234   GFRNONAA >60 02/01/2023 2212   GFRAA >60 05/31/2018 0755  BNP    Component Value Date/Time   BNP 24.2 12/22/2019 1432    ProBNP No results found for: PROBNP  Imaging: VAS US  CAROTID Result Date: 05/22/2024 Carotid Arterial Duplex Study Patient Name:  Tyaira Heward  Date of Exam:   05/22/2024 Medical Rec #: [id number]           Accession #:    [id number] Date of Birth: Dec 31, 1950           Patient Gender: F Patient Age:   20 years Exam Location:  [address] Procedure:      VAS US  CAROTID Referring Phys: FONDA ROBINS --------------------------------------------------------------------------------  Indications:       Carotid artery disease. Risk Factors:      Hypertension, Diabetes, no history of smoking, prior CVA. Comparison Study:  CTA 03/18/24: - high-grade proximal Left Subclavian Artery                    Stenosis estimated at 68%.                    - cervical Carotid and bilateral ICA siphon atherosclerosis                    without                    hemodynamically significant stenosis. Performing Technologist: [place] [employer]  Examination Guidelines: A complete evaluation includes B-mode imaging, spectral Doppler, color Doppler, and power Doppler as needed of all accessible portions of each vessel. Bilateral testing is considered an integral part of a complete examination. Limited examinations for reoccurring indications may be performed as noted.  Right  Carotid Findings: +----------+--------+--------+--------+------------------+--------+           PSV cm/sEDV cm/sStenosisPlaque DescriptionComments +----------+--------+--------+--------+------------------+--------+ CCA Prox  61      9                                          +----------+--------+--------+--------+------------------+--------+ CCA Mid   83      15                                         +----------+--------+--------+--------+------------------+--------+ CCA Distal73      16                                         +----------+--------+--------+--------+------------------+--------+ ICA Prox  76      16      1-39%   heterogenous               +----------+--------+--------+--------+------------------+--------+ ICA Mid   62      17                                         +----------+--------+--------+--------+------------------+--------+ ICA Distal83      18                                         +----------+--------+--------+--------+------------------+--------+ ECA  64      5                                          +----------+--------+--------+--------+------------------+--------+ +----------+--------+-------+--------+-------------------+           PSV cm/sEDV cmsDescribeArm Pressure (mmHG) +----------+--------+-------+--------+-------------------+ [id number]     308    Stenotic156                 +----------+--------+-------+--------+-------------------+ +---------+--------+--+--------+--+---------+ VertebralPSV cm/s78EDV cm/s78Antegrade +---------+--------+--+--------+--+---------+  Left Carotid Findings: +----------+--------+--------+--------+------------------+--------+           PSV cm/sEDV cm/sStenosisPlaque DescriptionComments +----------+--------+--------+--------+------------------+--------+ CCA Prox  76      14                                          +----------+--------+--------+--------+------------------+--------+ CCA Mid   80      16              heterogenous               +----------+--------+--------+--------+------------------+--------+ CCA Distal55      12                                         +----------+--------+--------+--------+------------------+--------+ ICA Prox  88      16      1-39%                              +----------+--------+--------+--------+------------------+--------+ ICA Mid   58      13                                         +----------+--------+--------+--------+------------------+--------+ ICA Distal64      22                                         +----------+--------+--------+--------+------------------+--------+ ECA       45      0                                          +----------+--------+--------+--------+------------------+--------+ +----------+--------+--------+--------+-------------------+           PSV cm/sEDV cm/sDescribeArm Pressure (mmHG) +----------+--------+--------+--------+-------------------+ Subclavian340     0       Stenotic142                 +----------+--------+--------+--------+-------------------+ +---------+--------+--+--------+-+---------------------+ VertebralPSV cm/s22EDV cm/s6Systolic deceleration +---------+--------+--+--------+-+---------------------+   Summary: Right Carotid: Velocities in the right ICA are consistent with a 1-39% stenosis. Left Carotid: Velocities in the left ICA are consistent with a 1-39% stenosis. Vertebrals:  Right vertebral artery demonstrates antegrade flow. Left verterbral              waveform demonstrates early systolic deceleration with a strong              retrograde compoenent. Subclavians: Normal flow hemodynamics were seen in bilateral subclavian  arteries. *See table(s) above for measurements and observations.  Electronically signed by Fonda Rim on 05/22/2024 at 2:32:39 PM.    Final      Administration History     None           No data to display          No results found for: NITRICOXIDE      Assessment & Plan:   Assessment and Plan Assessment & Plan Chronic insomnia and suspected obstructive sleep apnea   She experiences lifelong insomnia with difficulty initiating sleep and maintaining sleep with significant daytime sleepiness, sleeping only 2-4 hours per night due to chronic pain and hip discomfort. Previously diagnosed with sleep apnea in the early 2000s, she was treated with CPAP but discontinued it due to sinus infections. The differential includes persistent obstructive sleep apnea and daytime hypersomnia.  Untreated sleep apnea increases the risk for cerebrovascular and cardiovascular issues. Evaluating her current sleep apnea status is crucial to address daytime sleepiness and improve overall health. Order a sleep study at [facility] sleep lab to evaluate for obstructive sleep apnea. Provide information on sleep apnea and its risks. Advise maintaining a consistent sleep schedule, aiming for at least 6 hours of sleep per night. Recommend avoiding daytime napping and limiting bedroom time to 8 hours. Schedule a follow-up appointment in 6-8 weeks to discuss sleep study results. - discussed how weight can impact sleep and risk for sleep disordered breathing - discussed options to assist with weight loss: combination of diet modification, cardiovascular and strength training exercises   - had an extensive discussion regarding the adverse health consequences related to untreated sleep disordered breathing - specifically discussed the risks for hypertension, coronary artery disease, cardiac dysrhythmias, cerebrovascular disease, and diabetes - lifestyle modification discussed   - discussed how sleep disruption can increase risk of accidents, particularly when driving - safe driving practices were discussed  Chronic Insomnia-healthy sleep regimen  discussed in detail   Morbid Obesity-BMI 31. Healthy weight loss discussed   History of DM, HTN- continue follow up with PCP   History of Stroke and balance issues-continue follow up with Neurology.  Use caution with sedating medications.    Plan  Patient Instructions  Set up in lab sleep study  Work on healthy sleep regimen  Do not drive if sleepy  Follow up in 6 -8 weeks and As needed            Madelin Stank, NP 06/02/2024

## 2024-06-02 NOTE — Patient Instructions (Signed)
 Set up in lab sleep study  Work on healthy sleep regimen  Do not drive if sleepy  Follow up in 6 -8 weeks and As needed

## 2024-06-16 DIAGNOSIS — M549 Dorsalgia, unspecified: Secondary | ICD-10-CM | POA: Diagnosis not present

## 2024-06-16 DIAGNOSIS — R319 Hematuria, unspecified: Secondary | ICD-10-CM | POA: Diagnosis not present

## 2024-06-16 DIAGNOSIS — I5032 Chronic diastolic (congestive) heart failure: Secondary | ICD-10-CM | POA: Diagnosis not present

## 2024-06-16 DIAGNOSIS — M47816 Spondylosis without myelopathy or radiculopathy, lumbar region: Secondary | ICD-10-CM | POA: Diagnosis not present

## 2024-06-16 DIAGNOSIS — M4856XA Collapsed vertebra, not elsewhere classified, lumbar region, initial encounter for fracture: Secondary | ICD-10-CM | POA: Diagnosis not present

## 2024-06-18 NOTE — Progress Notes (Deleted)
 NEUROLOGY FOLLOW UP OFFICE NOTE  Cassandra Bridges [id number]  Subjective:  Cassandra Bridges is a 73 y.o. year old right-handed female with a medical history of HTN, HLD, CHF, DM c/b neuropathy and retinopathy, Bridges, chronic pain, anxiety, depression, OA, avascular necrosis of left hip, asthma, vit D deficiency who we last saw on 03/05/24 for imbalance and stroke.  To briefly review: 03/05/24: Patient has had poor balance for a long time, but it comes and goes. In the last couple of months, she noticed her balance being off more. She had a fall out of her truck (while sitting) for unclear reasons about 2 months ago. This has been her only recent fall.    She mentions difficulty lifting legs sometimes, right more than left. She has pain in bilateral hips from avascular necrosis. She has numbness and tingling in both feet which she attributes to neuropathy. She has pain in the bottom of her feet that can hurt more when she tries to put pressure on her feet. She also has numbness in her finger tips. She takes gabapentin 300 mg BID (no clear change in symptoms). She has been on this for 2 years. She does not think she has side effects from this.    She is to be taking Crestor  20 mg but is not taking it. She does not take glipizide and only occasionally takes Jardiance. She takes lasix . She does not like taking her medications though because she states they make her feel bad. She does not take aspirin .    She was taking Cymbalta for depression, but it did not help, so she stopped it.   Due to symptoms, patient's PCP ordered CT head. It showed age indeterminate, likely acute/subacute stroke in left superior cerebellum. There was also a remote lacunar infarct in posterior lentiform nucleus on right. This was on 01/15/24. When referral was sent, I recommended patient be seen at ED due to concern for acute stroke. Patient did not know about this and did not go.    Of note, patient has a history of Bridges and  had been on coumadin in the past. She also used to have IVC filter. She has no known history of abnormal heart rhythm such as atrial fibrillation.   She has no known history of OSA. She does not know if she snores. She does not feel well rested when waking. She feels tired throughout the day. She does not tend to sleep at night though. She may sleep from 8 am to 11 am. She finds it hard to sleep.   She mentions a remote history of migraines that stopped in her 22s. She has only headaches every now or then. She has noticed an increase of these as well. She now get 2-3 headaches per weeks. This has been over the last 3 months. They do not feel like her old migraines. She describes there being associated neck pain and stiffness associated with the headaches.   She has 50 lbs over 3 years gradually. She wasn't really trying to. She denies fevers or night sweats.   EtOH use: None Never smoker  Restrictive diet? No Family history of neurologic disease? Mother with brain tumors and history of blood clots   Most recent Assessment and Plan (03/05/24): Cassandra Bridges is a 73 y.o. female who presents for evaluation of imbalance. She has a relevant medical history of HTN, HLD, CHF, DM c/b neuropathy and retinopathy, Bridges, chronic pain, anxiety, depression, OA, avascular necrosis of left  hip, asthma, vit D deficiency. Her neurological examination is pertinent for bilateral lower extremity sensory loss below the knees. Available diagnostic data is significant for HbA1c of 7.1. CTH from 01/15/24 showed age indeterminate infarct in left superior cerebellum, remote lacunar infarct in the posterior right lentiform nucleus, and chronic microvascular ischemia. 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 This is likely the etiology of her imbalance. While she has no clear cerebellar signs on examination, a cerebellar stroke could also be  contributing. Patient has many risk factors for stroke, including HTN, HLD, DM, CHF, and possibly untreated OSA. She endorses not taking many of her medications for years but not telling her doctors. We discussed the importance of taking medications and talking to her doctors if she does not like any of her medications as there can be alternatives. I will work up both neuropathy and stroke as below. Of note, she also mentioned headaches today which sound most consistent with cervicogenic headaches. This can be addressed at future appointments.   PLAN: -Blood work: TSH, Lipid panel, CBC, CMP, B1, B12, folate, IFE, vit D -Could consider hypercoag work up if stroke work up appear embolic without other etiology -MRI brain wo contrast -CTA head and neck -Echocardiogram -Start aspirin  81 mg daily -Restart Crestor  20 mg daily. Speak with PCP if she does not like this medication -Sleep medicine referral (insomnia and possible OSA) -Discussed importance of taking medications and discussing medications she does not like with PCP -Stroke warning signs discussed -May consider PT for cervicalgia if headaches persist -Continue gabapentin 300 mg BID for now  Since their last visit: Work up did not show new stroke. CTA did show high grade left subclavian artery stenosis for which I referred her to vascular medicine. She saw Dr. Lanis on 05/22/24 who is monitoring.  ***  Need to talk about headaches too***  MEDICATIONS:  Outpatient Encounter Medications as of 06/26/2024  Medication Sig   albuterol (VENTOLIN HFA) 108 (90 Base) MCG/ACT inhaler    aspirin  EC 81 MG tablet Take 1 tablet (81 mg total) by mouth daily. Swallow whole.   Cholecalciferol (VITAMIN D3) 10 MCG (400 UNIT) tablet Take 400 Units by mouth daily.   furosemide  (LASIX ) 40 MG tablet TAKE 1 TABLET BY MOUTH  DAILY . MAY TAKE 1 EXTRA  TAB AS NEEDED FOR WEIGHT  GAIN OR SHORTNESS OF  BREATH. MAX 2 TABS PER DAY   gabapentin (NEURONTIN) 300 MG  capsule Take 300 mg by mouth 2 (two) times daily.   JARDIANCE 10 MG TABS tablet Take 10 mg by mouth daily.   Multiple Vitamin (MULTIVITAMIN) tablet Take 1 tablet by mouth daily.   No facility-administered encounter medications on file as of 06/26/2024.    PAST MEDICAL HISTORY: Past Medical History:  Diagnosis Date   Arthritis    Diabetes mellitus without complication (HCC)    Hypertension    Bridges (pulmonary thromboembolism) (HCC) 2007    PAST SURGICAL HISTORY: Past Surgical History:  Procedure Laterality Date   OTHER SURGICAL HISTORY  2008   left hip replacement   PARTIAL HYSTERECTOMY     TOTAL HIP ARTHROPLASTY      ALLERGIES: Allergies  Allergen Reactions   Effexor [Venlafaxine] Other (See Comments)    Altered mental status   Metformin Hcl     Other Reaction(s): groggy/confusion   Methylphenidate     Other Reaction(s): behavior changes   Penicillins Swelling    Has patient had  a PCN reaction causing immediate rash, facial/tongue/throat swelling, SOB or lightheadedness with hypotension: Yes Has patient had a PCN reaction causing severe rash involving mucus membranes or skin necrosis: Unknown Has patient had a PCN reaction that required hospitalization: No Has patient had a PCN reaction occurring within the last 10 years: No If all of the above answers are NO, then may proceed with Cephalosporin use.\    FAMILY HISTORY: Family History  Problem Relation Age of Onset   Cancer Mother    Heart attack Father     SOCIAL HISTORY: Social History   Tobacco Use   Smoking status: Never    Passive exposure: Past   Smokeless tobacco: Never  Vaping Use   Vaping status: Never Used  Substance Use Topics   Alcohol use: Never   Drug use: Never   Social History   Social History Narrative   Are you right handed or left handed? Right   Are you currently employed ?    What is your current occupation? retired   Do you live at home alone?   Who lives with you? yes   What  type of home do you live in: 1 story or 2 story? one    Caffeine 1-2 coffees a week and 1-2 sodas a week      Objective:  Vital Signs:  There were no vitals taken for this visit.  ***  Labs and Imaging review: New results: 03/05/24: CBC unremarkable CMP significant for glucose of 121 TSH wnl Lipid panel: tChol 166, LDL 85, TG 103 B1 wnl B12: 461 Folate wnl IFE: no M protein Vit D wnl  MRI brain wo contrast (03/18/24): FINDINGS: Brain:   Study is intermittently degraded by motion artifact despite repeated imaging attempts.   No restricted diffusion to suggest acute infarction. No midline shift, mass effect, evidence of mass lesion, ventriculomegaly, extra-axial collection or acute intracranial hemorrhage. Cervicomedullary junction and pituitary are within normal limits.   Chronic left cerebellum SCA territory infarcts (series 112 image 10). But no supratentorial cortical encephalomalacia. And no chronic cerebral blood products identified. Chronic lacunar infarct of the posterior right lentiform. And patchy additional bilateral cerebral white matter T2 and FLAIR hyperintensity which is mild to moderate for age.   Vascular: Major intracranial vascular flow voids are preserved, diminutive vertebrobasilar system concordant with CTA findings today.   Skull and upper cervical spine: Visualized bone marrow signal is within normal limits.   Sinuses/Orbits: Postoperative changes to both globes. Paranasal sinuses and mastoids are well aerated.   Other: Grossly normal visible internal auditory structures.   IMPRESSION: 1. No acute intracranial abnormality.  Mildly motion degraded exam. 2. Chronic ischemia in the Left cerebellum SCA territory, Right basal ganglia. Mild to moderate for age additional cerebral white matter changes most commonly due to small vessel disease.  CTA head and neck (03/18/24): IMPRESSION: 1. Negative for large vessel occlusion, but Positive  for atherosclerosis with: - high-grade proximal Left Subclavian Artery Stenosis estimated at 68%. - Diminutive Basilar artery on the basis of fetal type PCA origins. No significant Vertebrobasilar stenosis identified. - Mild to moderate Left MCA origin stenosis. - cervical Carotid and bilateral ICA siphon atherosclerosis without hemodynamically significant stenosis.   2. Unchanged CT appearance of Left Cerebellar infarct since May. No new intracranial abnormality.   3.  Aortic Atherosclerosis (ICD10-I70.0).  Previously reviewed results: 02/01/23: CBC w/ diff significant for Hb 11.3 CMP significant for K 3.2, glucose 123   External labs: 01/08/24: HbA1c: 7.1  Imaging/Procedures: CT head wo contrast (01/15/24): FINDINGS: Brain: No acute infarct, hemorrhage, or mass lesion is present. A remote lacunar infarct is present in the posterior right lentiform nucleus. Mild periventricular and scattered subcortical white matter hypoattenuation is present bilaterally. An age indeterminate, likely acute/subacute nonhemorrhagic infarct is present in the left superior cerebellum. The brainstem and cerebellum are otherwise unremarkable.   Vascular: Atherosclerotic calcifications are present at the cavernous internal carotid arteries bilaterally. No hyperdense vessel is present.   Skull: Calvarium is intact. No focal lytic or blastic lesions are present.   Sinuses/Orbits: Bilateral lens replacements are noted. Globes and orbits are otherwise unremarkable. The paranasal sinuses and mastoid air cells are clear.   IMPRESSION: 1. Age indeterminate, likely acute/subacute nonhemorrhagic infarct in the left superior cerebellum. 2. Remote lacunar infarct of the posterior right lentiform nucleus. 3. Mild periventricular and scattered subcortical white matter hypoattenuation bilaterally is nonspecific, but likely reflects the sequela of chronic microvascular ischemia.  Assessment/Plan:  This  is Cassandra Bridges, a 73 y.o. female with: ***   Plan: ***  Return to clinic in ***  Total time spent reviewing records, interview, history/exam, documentation, and coordination of care on day of encounter:  *** min  Venetia Potters, MD

## 2024-06-19 ENCOUNTER — Telehealth: Payer: Self-pay | Admitting: Orthopedic Surgery

## 2024-06-19 NOTE — Telephone Encounter (Signed)
Added to list for sooner appt

## 2024-06-19 NOTE — Telephone Encounter (Signed)
 Pt request a sooner appt than 07/17/24, from a Urgent referral from Dr Husain. Please call pt an advise.

## 2024-06-25 ENCOUNTER — Telehealth: Payer: Self-pay | Admitting: Neurology

## 2024-06-25 NOTE — Telephone Encounter (Signed)
 Called patient and she had to rescheduled her follow up appointment with Dr. Leigh. She is now scheduled for 11/14/24. Patient wants to know if this is ok and not to far out?   Patient also wanted to let Dr. Leigh know that the reason why she had to reschedule her appointment is because she had a fall a few days ago. After her fall she waited a few days to see if her pain would would get any better, and it didn't. She went to see Dr. Husain and he did xray's which showed two vertebrae compressed fractures. Patient stated she is doing OK and just wanted to let Dr. Leigh know what was going on.   Patient aware I will contact her back to let her know if the 11/14/24 appointment is ok with Dr. Leigh.

## 2024-06-25 NOTE — Telephone Encounter (Signed)
 Called patient and informed her of Dr. Loralee response. Patient stated she is on the wait list. Patient verbalized understanding and had no further questions or concerns.

## 2024-06-25 NOTE — Telephone Encounter (Signed)
 Pt having concerns about testing coming up and would like to speak to Dr. Leigh or nurse

## 2024-06-26 ENCOUNTER — Ambulatory Visit: Admitting: Neurology

## 2024-07-07 ENCOUNTER — Encounter: Payer: Self-pay | Admitting: Radiology

## 2024-07-17 ENCOUNTER — Ambulatory Visit (INDEPENDENT_AMBULATORY_CARE_PROVIDER_SITE_OTHER): Admitting: Orthopedic Surgery

## 2024-07-17 ENCOUNTER — Other Ambulatory Visit (INDEPENDENT_AMBULATORY_CARE_PROVIDER_SITE_OTHER): Payer: Self-pay

## 2024-07-17 VITALS — BP 157/75 | HR 86 | Ht 62.0 in | Wt 174.0 lb

## 2024-07-17 DIAGNOSIS — M545 Low back pain, unspecified: Secondary | ICD-10-CM

## 2024-07-17 DIAGNOSIS — R1032 Left lower quadrant pain: Secondary | ICD-10-CM | POA: Diagnosis not present

## 2024-07-17 DIAGNOSIS — Z96642 Presence of left artificial hip joint: Secondary | ICD-10-CM

## 2024-07-17 NOTE — Progress Notes (Signed)
 Orthopedic Spine Surgery Office Note  Assessment: Patient is a 73 y.o. female with acute worsening of chronic left hip pain   Plan: -Patient has tried tylenol, ibuprofen  -Given the higher energy mechanism of trauma and persistent pain, recommended a CT scna to evaluate for pelvic fracture -Can use tylenol up to 1000mg  TID to help with her pain -Since has no midline tenderness to palpation over her spine and states that she is not having any back pain, so would not work up the compression fracture further at this time -Patient should return to office in 3-4 weeks, x-rays at next visit: none   Patient expressed understanding of the plan and all questions were answered to the patient's satisfaction.   ___________________________________________________________________________   History:  Patient is a 73 y.o. female who presents today for lumbar spine. Patient has a history of chronic left hip pain. She said it comes and goes. She comes in today for left groin pain. She said she was working on her roof when she feel to the ground and noted acute onset of left groin pain. This happened about three weeks ago. Pain has persisted ever since. She initially had some back pain but she is no longer having any back pain. She has no pain radiating further than the groin. No pain in the right lower extremity.    Weakness: denies Paresthesias and numbness: denies Bowel or bladder incontinence: Yes, has had years of issues with urinary incontinence. Wears depends regularly. No recent changes in bowel or bladder habits.  Saddle anesthesia: denies  Treatments tried: tylenol, ibuprofen  Review of systems: Denies fevers and chills, night sweats, unexplained weight loss, history of cancer. Has had pain that wakes her at night  Past medical history: DM HTN History of PE History of stroke Anxiety  Allergies: effexor, methylphenidate, penicillin, metformin  Past surgical history:  Left  THA Hysterectomy Bladder suspension  Social history: Denies use of nicotine product (smoking, vaping, patches, smokeless) Alcohol use: Denies Denies recreational drug use   Physical Exam:  BMI of 31.8  General: no acute distress, appears stated age Neurologic: alert, answering questions appropriately, following commands Respiratory: unlabored breathing on room air, symmetric chest rise Psychiatric: appropriate affect, normal cadence to speech   MSK (spine):  -Strength exam      Left  Right EHL    5/5  5/5 TA    5/5  5/5 GSC    5/5  5/5 Knee extension  5/5  5/5 Hip flexion   5/5  5/5  -Sensory exam    Sensation intact to light touch in L3-S1 nerve distributions of bilateral lower extremities  -Achilles DTR: 1/4 on the left, 1/4 on the right -Patellar tendon DTR: 1/4 on the left, 1/4 on the right  -Straight leg raise: negative bilaterally -Clonus: no beats bilaterally  -Left hip exam: positive fadir, positive faber, negative si joint compression test, no pain through mid range of motion -Right hip exam: no pain through range of motion  -No midline tenderness to palpation over the thoracic or lumbar spine  Imaging: XRs of the pelvis from 07/17/2024 were independently reviewed and interpreted, showing left total hip arthroplasty in place. No lucency seen around the cup or stem. No fracture or dislocation seen.   XRs of the lumbar spine from 07/17/2024 were independently reviewed and interpreted, showing a L2 compression fracture with anterior height loss. This fracture appears to be acute. Disc height loss at L5/S1. No dislocation seen.    Patient name: Cassandra  Neya Bridges Patient MRN: [id number] Date of visit: 07/17/24

## 2024-07-24 ENCOUNTER — Ambulatory Visit
Admission: RE | Admit: 2024-07-24 | Discharge: 2024-07-24 | Disposition: A | Source: Ambulatory Visit | Attending: Orthopedic Surgery

## 2024-07-24 DIAGNOSIS — R1032 Left lower quadrant pain: Secondary | ICD-10-CM

## 2024-07-24 NOTE — Progress Notes (Signed)
 NEUROLOGY FOLLOW UP OFFICE NOTE  Cassandra Bridges [id number]  Subjective:  Cassandra Bridges is a 73 y.o. year old right-handed female with a medical history of HTN, HLD, CHF, DM c/b neuropathy and retinopathy, PE, chronic pain, anxiety, depression, OA, avascular necrosis of left hip, asthma, vit D deficiency who we last saw on 03/05/24 for imbalance and stroke.  To briefly review: 03/05/24: Patient has had poor balance for a long time, but it comes and goes. In the last couple of months, she noticed her balance being off more. She had a fall out of her truck (while sitting) for unclear reasons about 2 months ago. This has been her only recent fall.    She mentions difficulty lifting legs sometimes, right more than left. She has pain in bilateral hips from avascular necrosis. She has numbness and tingling in both feet which she attributes to neuropathy. She has pain in the bottom of her feet that can hurt more when she tries to put pressure on her feet. She also has numbness in her finger tips. She takes gabapentin 300 mg BID (no clear change in symptoms). She has been on this for 2 years. She does not think she has side effects from this.    She is to be taking Crestor  20 mg but is not taking it. She does not take glipizide and only occasionally takes Jardiance. She takes lasix . She does not like taking her medications though because she states they make her feel bad. She does not take aspirin .    She was taking Cymbalta for depression, but it did not help, so she stopped it.   Due to symptoms, patient's PCP ordered CT head. It showed age indeterminate, likely acute/subacute stroke in left superior cerebellum. There was also a remote lacunar infarct in posterior lentiform nucleus on right. This was on 01/15/24. When referral was sent, I recommended patient be seen at ED due to concern for acute stroke. Patient did not know about this and did not go.    Of note, patient has a history of PE and  had been on coumadin in the past. She also used to have IVC filter. She has no known history of abnormal heart rhythm such as atrial fibrillation.   She has no known history of OSA. She does not know if she snores. She does not feel well rested when waking. She feels tired throughout the day. She does not tend to sleep at night though. She may sleep from 8 am to 11 am. She finds it hard to sleep.   She mentions a remote history of migraines that stopped in her 89s. She has only headaches every now or then. She has noticed an increase of these as well. She now get 2-3 headaches per weeks. This has been over the last 3 months. They do not feel like her old migraines. She describes there being associated neck pain and stiffness associated with the headaches.   She has 50 lbs over 3 years gradually. She wasn't really trying to. She denies fevers or night sweats.   EtOH use: None Never smoker  Restrictive diet? No Family history of neurologic disease? Mother with brain tumors and history of blood clots   Most recent Assessment and Plan (03/05/24): Cassandra Bridges is a 73 y.o. female who presents for evaluation of imbalance. She has a relevant medical history of HTN, HLD, CHF, DM c/b neuropathy and retinopathy, PE, chronic pain, anxiety, depression, OA, avascular necrosis of left  hip, asthma, vit D deficiency. Her neurological examination is pertinent for bilateral lower extremity sensory loss below the knees. Available diagnostic data is significant for HbA1c of 7.1. CTH from 01/15/24 showed age indeterminate infarct in left superior cerebellum, remote lacunar infarct in the posterior right lentiform nucleus, and chronic microvascular ischemia. 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 This is likely the etiology of her imbalance. While she has no clear cerebellar signs on examination, a cerebellar stroke could also be  contributing. Patient has many risk factors for stroke, including HTN, HLD, DM, CHF, and possibly untreated OSA. She endorses not taking many of her medications for years but not telling her doctors. We discussed the importance of taking medications and talking to her doctors if she does not like any of her medications as there can be alternatives. I will work up both neuropathy and stroke as below. Of note, she also mentioned headaches today which sound most consistent with cervicogenic headaches. This can be addressed at future appointments.   PLAN: -Blood work: TSH, Lipid panel, CBC, CMP, B1, B12, folate, IFE, vit D -Could consider hypercoag work up if stroke work up appear embolic without other etiology -MRI brain wo contrast -CTA head and neck -Echocardiogram -Start aspirin  81 mg daily -Restart Crestor  20 mg daily. Speak with PCP if she does not like this medication -Sleep medicine referral (insomnia and possible OSA) -Discussed importance of taking medications and discussing medications she does not like with PCP -Stroke warning signs discussed -May consider PT for cervicalgia if headaches persist -Continue gabapentin 300 mg BID for now  Since their last visit: Labs were normal. Work up did not show new stroke. CTA did show high grade left subclavian artery stenosis for which I referred her to vascular medicine. She saw Dr. Lanis on 05/22/24 who is monitoring.  Patient had a fall from her roof about a month ago. She was up there cleaning it off because no one else would do it. She hurt her left hip and back.   Her balance is doing okay. Other than the fall off the roof, she denies other falls.  We did not have time to talk about headaches last visit. She has about 3 headaches a month. Her headaches start in her neck. It will radiate into her whole head. She will put vapor rub or wash her hair in cold water and this will help. She rarely will take tylenol but she doesn't like to take  medicine.  She is taking aspirin  81 mg daily. She has not been taking Crestor  20 mg daily but thinks she picked this up today.  She is seeing sleep medicine on 08/01/24.  She takes gabapentin 300 mg as needed (takes it 3-4 times per week).  MEDICATIONS:  Outpatient Encounter Medications as of 07/25/2024  Medication Sig   albuterol (VENTOLIN HFA) 108 (90 Base) MCG/ACT inhaler    aspirin  EC 81 MG tablet Take 1 tablet (81 mg total) by mouth daily. Swallow whole.   furosemide  (LASIX ) 40 MG tablet TAKE 1 TABLET BY MOUTH  DAILY . MAY TAKE 1 EXTRA  TAB AS NEEDED FOR WEIGHT  GAIN OR SHORTNESS OF  BREATH. MAX 2 TABS PER DAY   gabapentin (NEURONTIN) 300 MG capsule Take 300 mg by mouth 2 (two) times daily.   JARDIANCE 10 MG TABS tablet Take 10 mg by mouth daily.   MACROBID 100 MG capsule Take 100 mg by mouth 2 (two) times  daily.   Multiple Vitamin (MULTIVITAMIN) tablet Take 1 tablet by mouth daily.   potassium chloride (KLOR-CON M) 10 MEQ tablet Take 10 mEq by mouth daily.   rosuvastatin  (CRESTOR ) 20 MG tablet Take 20 mg by mouth daily.   traMADol (ULTRAM) 50 MG tablet Take 50 mg by mouth every 6 (six) hours as needed.   Cholecalciferol (VITAMIN D3) 10 MCG (400 UNIT) tablet Take 400 Units by mouth daily.   No facility-administered encounter medications on file as of 07/25/2024.    PAST MEDICAL HISTORY: Past Medical History:  Diagnosis Date   Arthritis    Diabetes mellitus without complication (HCC)    Hypertension    PE (pulmonary thromboembolism) (HCC) 2007    PAST SURGICAL HISTORY: Past Surgical History:  Procedure Laterality Date   OTHER SURGICAL HISTORY  2008   left hip replacement   PARTIAL HYSTERECTOMY     TOTAL HIP ARTHROPLASTY      ALLERGIES: Allergies  Allergen Reactions   Effexor [Venlafaxine] Other (See Comments)    Altered mental status   Metformin Hcl     Other Reaction(s): groggy/confusion   Methylphenidate     Other Reaction(s): behavior changes   Penicillins  Swelling    Has patient had a PCN reaction causing immediate rash, facial/tongue/throat swelling, SOB or lightheadedness with hypotension: Yes Has patient had a PCN reaction causing severe rash involving mucus membranes or skin necrosis: Unknown Has patient had a PCN reaction that required hospitalization: No Has patient had a PCN reaction occurring within the last 10 years: No If all of the above answers are NO, then may proceed with Cephalosporin use.\    FAMILY HISTORY: Family History  Problem Relation Age of Onset   Cancer Mother    Heart attack Father     SOCIAL HISTORY: Social History   Tobacco Use   Smoking status: Never    Passive exposure: Past   Smokeless tobacco: Never  Vaping Use   Vaping status: Never Used  Substance Use Topics   Alcohol use: Never   Drug use: Never   Social History   Social History Narrative   Are you right handed or left handed? Right   Are you currently employed ?    What is your current occupation? retired   Do you live at home alone?   Who lives with you? yes   What type of home do you live in: 1 story or 2 story? one    Caffeine 1-2 coffees a week and 1-2 sodas a week      Objective:  Vital Signs:  BP 119/65   Pulse (!) 102   Ht 5' 2 (1.575 m)   Wt 163 lb (73.9 kg)   SpO2 100%   BMI 29.81 kg/m   General: General appearance: Awake and alert. No distress. Cooperative with exam.  Skin: No obvious rash or jaundice. HEENT: Atraumatic. Anicteric. Tightness and tenderness of cervical paraspinal muscles and trapezius. Reduced range of motion in neck.  Lungs: Non-labored breathing on room air  Heart: Regular Extremities: Bilateral lower extremity edema  Psych: Affect appropriate.  Neurological: Mental Status: Alert. Speech fluent. No pseudobulbar affect Cranial Nerves: CNII: No RAPD. Visual fields intact. CNIII, IV, VI: PERRL. No nystagmus. EOMI. CN V: Facial sensation intact bilaterally to fine touch. CN VII: Facial  muscles symmetric and strong. No ptosis at rest. CN VIII: Hears finger rub well bilaterally. CN IX: No hypophonia. CN X: Palate elevates symmetrically. CN XI: Full strength shoulder shrug bilaterally. CN  XII: Tongue protrusion full and midline. No atrophy or fasciculations. No significant dysarthria Motor: Tone is normal. 5/5 in bilateral upper and lower extremities  Reflexes:  Right Left   Bicep 2+ 2+   Tricep 2+ 2+   BrRad 2+ 2+   Knee 0 0   Ankle 0 0    Sensation: Pinprick: Absent in lower extremities to the knees bilaterally Vibration: Absent in lower extremities to the knees bilaterally Proprioception: Diminished in bilateral great toes Coordination: Intact finger-to- nose-finger bilaterally Gait: Walks with cane. Wide based, slow, antalgic gait.   Labs and Imaging review: New results: 03/05/24: CBC unremarkable CMP significant for glucose of 121 TSH wnl Lipid panel: tChol 166, LDL 85, TG 103 B1 wnl B12: 461 Folate wnl IFE: no M protein Vit D wnl  MRI brain wo contrast (03/18/24): FINDINGS: Brain:   Study is intermittently degraded by motion artifact despite repeated imaging attempts.   No restricted diffusion to suggest acute infarction. No midline shift, mass effect, evidence of mass lesion, ventriculomegaly, extra-axial collection or acute intracranial hemorrhage. Cervicomedullary junction and pituitary are within normal limits.   Chronic left cerebellum SCA territory infarcts (series 112 image 10). But no supratentorial cortical encephalomalacia. And no chronic cerebral blood products identified. Chronic lacunar infarct of the posterior right lentiform. And patchy additional bilateral cerebral white matter T2 and FLAIR hyperintensity which is mild to moderate for age.   Vascular: Major intracranial vascular flow voids are preserved, diminutive vertebrobasilar system concordant with CTA findings today.   Skull and upper cervical spine: Visualized  bone marrow signal is within normal limits.   Sinuses/Orbits: Postoperative changes to both globes. Paranasal sinuses and mastoids are well aerated.   Other: Grossly normal visible internal auditory structures.   IMPRESSION: 1. No acute intracranial abnormality.  Mildly motion degraded exam. 2. Chronic ischemia in the Left cerebellum SCA territory, Right basal ganglia. Mild to moderate for age additional cerebral white matter changes most commonly due to small vessel disease.  CTA head and neck (03/18/24): IMPRESSION: 1. Negative for large vessel occlusion, but Positive for atherosclerosis with: - high-grade proximal Left Subclavian Artery Stenosis estimated at 68%. - Diminutive Basilar artery on the basis of fetal type PCA origins. No significant Vertebrobasilar stenosis identified. - Mild to moderate Left MCA origin stenosis. - cervical Carotid and bilateral ICA siphon atherosclerosis without hemodynamically significant stenosis.   2. Unchanged CT appearance of Left Cerebellar infarct since May. No new intracranial abnormality.   3.  Aortic Atherosclerosis (ICD10-I70.0).  Previously reviewed results: 02/01/23: CBC w/ diff significant for Hb 11.3 CMP significant for K 3.2, glucose 123   External labs: 01/08/24: HbA1c: 7.1   Imaging/Procedures: CT head wo contrast (01/15/24): FINDINGS: Brain: No acute infarct, hemorrhage, or mass lesion is present. A remote lacunar infarct is present in the posterior right lentiform nucleus. Mild periventricular and scattered subcortical white matter hypoattenuation is present bilaterally. An age indeterminate, likely acute/subacute nonhemorrhagic infarct is present in the left superior cerebellum. The brainstem and cerebellum are otherwise unremarkable.   Vascular: Atherosclerotic calcifications are present at the cavernous internal carotid arteries bilaterally. No hyperdense vessel is present.   Skull: Calvarium is intact. No focal  lytic or blastic lesions are present.   Sinuses/Orbits: Bilateral lens replacements are noted. Globes and orbits are otherwise unremarkable. The paranasal sinuses and mastoid air cells are clear.   IMPRESSION: 1. Age indeterminate, likely acute/subacute nonhemorrhagic infarct in the left superior cerebellum. 2. Remote lacunar infarct of the posterior right lentiform nucleus. 3.  Mild periventricular and scattered subcortical white matter hypoattenuation bilaterally is nonspecific, but likely reflects the sequela of chronic microvascular ischemia.  Assessment/Plan:  This is Cassandra Bridges, a 73 y.o. female with: Imbalance - likely due to diabetic polyneuropathy with possible contributions from cerebellar stroke and OA Diabetic neuropathy Stroke -  CTH from 01/15/24 showed age indeterminate infarct in left superior cerebellum, remote lacunar infarct in the posterior right lentiform nucleus, and chronic microvascular ischemia. Patient has many risk factors for stroke, including HTN, HLD, DM, CHF, and possibly untreated OSA. Patient is not consistent taking her medications. Headaches and neck pain - consistent with cervicogenic headaches. Patient agreeable to PT.  Plan: -Continue aspirin  81 mg daily -Encouraged patient to restart Crestor  20 mg daily -Discussed importance of taking medications and discussing medications she does not like with PCP -See sleep medicine as planned -PT for cervicogenic headaches -Continue gabapentin 300 mg up to twice daily as needed -Stroke warning signs discussed -Fall precautions discussed  Return to clinic in 6 months  Total time spent reviewing records, interview, history/exam, documentation, and coordination of care on day of encounter:  45 min  Venetia Potters, MD

## 2024-07-25 ENCOUNTER — Encounter: Payer: Self-pay | Admitting: Neurology

## 2024-07-25 ENCOUNTER — Ambulatory Visit (INDEPENDENT_AMBULATORY_CARE_PROVIDER_SITE_OTHER): Admitting: Neurology

## 2024-07-25 VITALS — BP 119/65 | HR 102 | Ht 62.0 in | Wt 163.0 lb

## 2024-07-25 DIAGNOSIS — E1142 Type 2 diabetes mellitus with diabetic polyneuropathy: Secondary | ICD-10-CM

## 2024-07-25 DIAGNOSIS — R2689 Other abnormalities of gait and mobility: Secondary | ICD-10-CM

## 2024-07-25 DIAGNOSIS — I639 Cerebral infarction, unspecified: Secondary | ICD-10-CM | POA: Diagnosis not present

## 2024-07-25 DIAGNOSIS — G4486 Cervicogenic headache: Secondary | ICD-10-CM

## 2024-07-25 DIAGNOSIS — M542 Cervicalgia: Secondary | ICD-10-CM

## 2024-07-25 DIAGNOSIS — Z72820 Sleep deprivation: Secondary | ICD-10-CM

## 2024-07-25 DIAGNOSIS — G4700 Insomnia, unspecified: Secondary | ICD-10-CM

## 2024-07-25 NOTE — Patient Instructions (Addendum)
 I am going to send you to physical therapy for your neck. I think this is causing your headaches.  You should check your feet daily as you do not feel them well.  You can take gabapentin 300 mg up to twice daily for nerve pain.  Continue aspirin  81 mg daily.  Please take your Crestor  20 mg daily.  If you have new difficulty speaking, face droop, numbness on one side of the body, weakness on one side of the body, or dizziness/imbalance, this could be the sign of a stroke. Don't wait, please call EMS and be evaluated at the nearest emergency room.   The physicians and staff at [facility] are committed to providing excellent care. You may receive a survey requesting feedback about your experience at our office. We strive to receive very good responses to the survey questions. If you feel that your experience would prevent you from giving the office a very good  response, please contact our office to try to remedy the situation. We may be reached at [phone]. Thank you for taking the time out of your busy day to complete the survey.  Venetia Potters, MD [facility]  Preventing Falls at [facility] are common, often dreaded events in the lives of older people. Aside from the obvious injuries and even death that may result, fall can cause wide-ranging consequences including loss of independence, mental decline, decreased activity and mobility. Younger people are also at risk of falling, especially those with chronic illnesses and fatigue.  Ways to reduce risk for falling Examine diet and medications. Warm foods and alcohol dilate blood vessels, which can lead to dizziness when standing. Sleep aids, antidepressants and pain medications can also increase the likelihood of a fall.  Get a vision exam. Poor vision, cataracts and glaucoma increase the chances of falling.  Check foot gear. Shoes should fit snugly and have a sturdy, nonskid sole and a broad, low heel  Participate in a  physician-approved exercise program to build and maintain muscle strength and improve balance and coordination. Programs that use ankle weights or stretch bands are excellent for muscle-strengthening. Water aerobics programs and low-impact Tai Chi programs have also been shown to improve balance and coordination.  Increase vitamin D  intake. Vitamin D  improves muscle strength and increases the amount of calcium  the body is able to absorb and deposit in bones.  How to prevent falls from common hazards Floors - Remove all loose wires, cords, and throw rugs. Minimize clutter. Make sure rugs are anchored and smooth. Keep furniture in its usual place.  Chairs -- Use chairs with straight backs, armrests and firm seats. Add firm cushions to existing pieces to add height.  Bathroom - Install grab bars and non-skid tape in the tub or shower. Use a bathtub transfer bench or a shower chair with a back support Use an elevated toilet seat and/or safety rails to assist standing from a low surface. Do not use towel racks or bathroom tissue holders to help you stand.  Lighting - Make sure halls, stairways, and entrances are well-lit. Install a night light in your bathroom or hallway. Make sure there is a light switch at the top and bottom of the staircase. Turn lights on if you get up in the middle of the night. Make sure lamps or light switches are within reach of the bed if you have to get up during the night.  Kitchen - Install non-skid rubber mats near the sink and stove. Clean spills immediately.  Store frequently used utensils, pots, pans between waist and eye level. This helps prevent reaching and bending. Sit when getting things out of lower cupboards.  Living room/ Bedrooms - Place furniture with wide spaces in between, giving enough room to move around. Establish a route through the living room that gives you something to hold onto as you walk.  Stairs - Make sure treads, rails, and rugs are secure. Install  a rail on both sides of the stairs. If stairs are a threat, it might be helpful to arrange most of your activities on the lower level to reduce the number of times you must climb the stairs.  Entrances and doorways - Install metal handles on the walls adjacent to the doorknobs of all doors to make it more secure as you travel through the doorway.  Tips for maintaining balance Keep at least one hand free at all times. Try using a backpack or fanny pack to hold things rather than carrying them in your hands. Never carry objects in both hands when walking as this interferes with keeping your balance.  Attempt to swing both arms from front to back while walking. This might require a conscious effort if Parkinson's disease has diminished your movement. It will, however, help you to maintain balance and posture, and reduce fatigue.  Consciously lift your feet off of the ground when walking. Shuffling and dragging of the feet is a common culprit in losing your balance.  When trying to navigate turns, use a U technique of facing forward and making a wide turn, rather than pivoting sharply.  Try to stand with your feet shoulder-length apart. When your feet are close together for any length of time, you increase your risk of losing your balance and falling.  Do one thing at a time. Don't try to walk and accomplish another task, such as reading or looking around. The decrease in your automatic reflexes complicates motor function, so the less distraction, the better.  Do not wear rubber or gripping soled shoes, they might catch on the floor and cause tripping.  Move slowly when changing positions. Use deliberate, concentrated movements and, if needed, use a grab bar or walking aid. Count 15 seconds between each movement. For example, when rising from a seated position, wait 15 seconds after standing to begin walking.  If balance is a continuous problem, you might want to consider a walking aid such as a  cane, walking stick, or walker. Once you've mastered walking with help, you might be ready to try it on your own again.

## 2024-08-01 ENCOUNTER — Ambulatory Visit (HOSPITAL_BASED_OUTPATIENT_CLINIC_OR_DEPARTMENT_OTHER): Admitting: Pulmonary Disease

## 2024-08-04 ENCOUNTER — Telehealth: Payer: Self-pay | Admitting: Neurology

## 2024-08-04 ENCOUNTER — Encounter: Payer: Self-pay | Admitting: Neurology

## 2024-08-04 NOTE — Telephone Encounter (Signed)
 Cassandra Bridges( Self)  PH: [phone]  Reason for Call:  Called and lvm, stating she was returning a call she think it was from Dr. Leigh around 8:15 this morning.

## 2024-08-04 NOTE — Telephone Encounter (Signed)
 Patient [facility] her appt at the sleep center, she wanted the number to call [place]. Number was given and she thanked me for calling

## 2024-08-06 ENCOUNTER — Other Ambulatory Visit: Payer: Self-pay | Admitting: Internal Medicine

## 2024-08-06 DIAGNOSIS — Z1231 Encounter for screening mammogram for malignant neoplasm of breast: Secondary | ICD-10-CM

## 2024-08-25 ENCOUNTER — Ambulatory Visit (INDEPENDENT_AMBULATORY_CARE_PROVIDER_SITE_OTHER): Admitting: Orthopedic Surgery

## 2024-08-25 DIAGNOSIS — M25552 Pain in left hip: Secondary | ICD-10-CM

## 2024-08-25 NOTE — Progress Notes (Signed)
 Orthopedic Office Note  Patient comes in today with persistent left hip pain.  She has not noticed any changes in her symptoms since she was last seen.  Pain still worse with activity but better with rest.  She feels it in the groin and in the perineum.  She has no pain radiating past the area.  She has no pain in the right lower extremity.  She has noticed pain developing across her low back and going into the right buttock.  Has had years of issues with urinary continence.  No recent changes in bowel or bladder habits.  No saddle anesthesia.  On exam, her left hip with positive FADIR.  Negative left hip Faber, negative SI joint compression test, no pain through mid range of motion.  CT of the pelvis from 07/24/2024 were independently reviewed and interpreted, showing no fracture or dislocation.  Hip arthroplasty in place.  No lucency seen around the stem or the cup.  Plan: -Since patient has tried over 6 weeks of conservative treatment including Tylenol, ibuprofen, tramadol, activity modification, I recommended a MRI of the lumbar spine to evaluate for possible L5 radiculopathy or a L1 radiculopathy -Patient interested in medication stronger than tramadol, provided her with a referral to pain management -Patient should return to the office in 4 to 5 weeks, x-rays at next visit: none   Ozell DELENA Ada, MD Orthopedic Surgeon

## 2024-09-15 ENCOUNTER — Ambulatory Visit (HOSPITAL_BASED_OUTPATIENT_CLINIC_OR_DEPARTMENT_OTHER): Attending: Adult Health | Admitting: Pulmonary Disease

## 2024-09-15 DIAGNOSIS — G478 Other sleep disorders: Secondary | ICD-10-CM | POA: Insufficient documentation

## 2024-09-15 DIAGNOSIS — G4733 Obstructive sleep apnea (adult) (pediatric): Secondary | ICD-10-CM | POA: Insufficient documentation

## 2024-09-15 DIAGNOSIS — G4719 Other hypersomnia: Secondary | ICD-10-CM | POA: Insufficient documentation

## 2024-09-18 ENCOUNTER — Ambulatory Visit (HOSPITAL_BASED_OUTPATIENT_CLINIC_OR_DEPARTMENT_OTHER): Admitting: Pulmonary Disease

## 2024-09-18 ENCOUNTER — Ambulatory Visit
Admission: RE | Admit: 2024-09-18 | Discharge: 2024-09-18 | Disposition: A | Source: Ambulatory Visit | Attending: Internal Medicine | Admitting: Internal Medicine

## 2024-09-18 DIAGNOSIS — Z1231 Encounter for screening mammogram for malignant neoplasm of breast: Secondary | ICD-10-CM

## 2024-09-27 DIAGNOSIS — G478 Other sleep disorders: Secondary | ICD-10-CM | POA: Diagnosis not present

## 2024-09-27 NOTE — Procedures (Signed)
 Darryle [facility] [address] [place], [place] [address] Tel: [phone]   Fax: [phone]  Polysomnography Interpretation  Patient Name:  Cassandra Bridges, Cassandra Bridges Study Date:  09/15/2024 Referring Physician:  MADELIN PARRETT [phone]) %%startinterp%% Indications for Polysomnography The patient is a 74 year old Female who is 5' 3 and weighs 165.0 lbs. Her BMI equals 29.2.  A full night polysomnogram was performed to evaluate for OSA & insomnia.  Polysomnogram Data A full night polysomnogram recorded the standard physiologic parameters including EEG, EOG, EMG, EKG, nasal and oral airflow.  Respiratory parameters of chest and abdominal movements were recorded with Respiratory Inductance Plethysmography belts.  Oxygen saturation was recorded by pulse oximetry.   Sleep Architecture The total recording time of the polysomnogram was 376.3 minutes.  The total sleep time was 286.5 minutes.  The patient spent 7.9% of total sleep time in Stage N1, 45.7% in Stage N2, 38.2% in Stages N3, and 8.2% in REM.  Sleep latency was 3.3 minutes.  REM latency was 37.0 minutes.  Sleep Efficiency was 76.1%.  Wake after Sleep Onset time was 86.5 minutes.  Respiratory Events The polysomnogram revealed a presence of - obstructive, - central, and - mixed apneas resulting in an Apnea index of - events per hour.  There were 58 hypopneas (>=3% desaturation and/or arousal) resulting in an Apnea\Hypopnea Index (AHI >=3% desaturation and/or arousal) of 12.1 events per hour.  There were 17 hypopneas (>=4% desaturation) resulting in an Apnea\Hypopnea Index (AHI >=4% desaturation) of 3.6 events per hour.  There were 2 Respiratory Effort Related Arousals resulting in a RERA index of 0.4 events per hour. The Respiratory Disturbance Index is 12.6 events per hour.  The snore index was - events per hour.  Mean oxygen saturation was 95.4%.  The lowest oxygen saturation during sleep was 88.0%.  Time spent <=88%  oxygen saturation was 0.1 minutes (-).  Limb Activity There were 88 total limb movements recorded, of this total, 50 were classified as PLMs.  PLM index was 10.5 per hour and PLM associated with Arousals index was 1.3 per hour.  Cardiac Summary The average pulse rate was 96.3 bpm.  The minimum pulse rate was 56.0 bpm while the maximum pulse rate was 115.0 bpm.  Cardiac rhythm was normal/abnormal.  Comments: 4% AHI was not significant. Supine sleep was not noted. PLMs were noted but PLM arousals were not signifincant  Diagnosis: Upper airway resistance syndrome  Recommendations: Co-relate with clinical history Sleep hygeine should be encouraged. Patient should be cautioned against driving when sleepy and should avoid medication with sedative side effects     This study was personally reviewed and electronically signed by: Jude Donning, MD Accredited Board Certified in Sleep Medicine Date/Time: 09/27/2024

## 2024-09-29 ENCOUNTER — Ambulatory Visit: Admitting: Orthopedic Surgery

## 2024-09-30 ENCOUNTER — Ambulatory Visit: Payer: Self-pay | Admitting: Adult Health

## 2024-10-07 ENCOUNTER — Ambulatory Visit: Admitting: Adult Health

## 2024-11-11 ENCOUNTER — Ambulatory Visit: Admitting: Adult Health

## 2024-11-13 ENCOUNTER — Ambulatory Visit: Admitting: Orthopedic Surgery

## 2024-11-14 ENCOUNTER — Ambulatory Visit: Admitting: Neurology

## 2025-01-28 ENCOUNTER — Ambulatory Visit: Admitting: Neurology
